# Patient Record
Sex: Male | Born: 1945 | Race: White | Hispanic: No | Marital: Married | Smoking: Former smoker
Health system: Southern US, Community
[De-identification: ages and names within clinical notes are randomized; demographics above are authoritative.]

## PROBLEM LIST (undated history)

## (undated) DIAGNOSIS — N189 Chronic kidney disease, unspecified: Secondary | ICD-10-CM

## (undated) DIAGNOSIS — I10 Essential (primary) hypertension: Secondary | ICD-10-CM

## (undated) DIAGNOSIS — Z8719 Personal history of other diseases of the digestive system: Secondary | ICD-10-CM

## (undated) DIAGNOSIS — J449 Chronic obstructive pulmonary disease, unspecified: Secondary | ICD-10-CM

## (undated) DIAGNOSIS — Z8711 Personal history of peptic ulcer disease: Secondary | ICD-10-CM

## (undated) DIAGNOSIS — E119 Type 2 diabetes mellitus without complications: Secondary | ICD-10-CM

## (undated) DIAGNOSIS — K219 Gastro-esophageal reflux disease without esophagitis: Secondary | ICD-10-CM

## (undated) DIAGNOSIS — G4730 Sleep apnea, unspecified: Secondary | ICD-10-CM

## (undated) DIAGNOSIS — I4891 Unspecified atrial fibrillation: Secondary | ICD-10-CM

## (undated) DIAGNOSIS — C801 Malignant (primary) neoplasm, unspecified: Secondary | ICD-10-CM

## (undated) HISTORY — PX: COLONOSCOPY: SHX174

## (undated) HISTORY — PX: SURGERY OF LIP: SUR1315

## (undated) HISTORY — PX: WRIST SURGERY: SHX841

## (undated) HISTORY — PX: UPPER GI ENDOSCOPY: SHX6162

## (undated) HISTORY — PX: SKIN CANCER EXCISION: SHX779

---

## 2018-10-02 ENCOUNTER — Institutional Professional Consult (permissible substitution) (HOSPITAL_COMMUNITY): Payer: Medicare Other

## 2018-10-02 ENCOUNTER — Inpatient Hospital Stay
Admission: AD | Admit: 2018-10-02 | Discharge: 2018-10-21 | Disposition: A | Discharge status: Other Acute Inpatient Hospital | Payer: Medicare Other | Source: Other Acute Inpatient Hospital | Attending: Internal Medicine | Admitting: Internal Medicine

## 2018-10-02 DIAGNOSIS — T8241XA Breakdown (mechanical) of vascular dialysis catheter, initial encounter: Secondary | ICD-10-CM

## 2018-10-02 DIAGNOSIS — K567 Ileus, unspecified: Secondary | ICD-10-CM

## 2018-10-02 DIAGNOSIS — R0603 Acute respiratory distress: Secondary | ICD-10-CM

## 2018-10-03 LAB — CBC WITH DIFFERENTIAL/PLATELET
Abs Immature Granulocytes: 0.3 10*3/uL — ABNORMAL HIGH (ref 0.00–0.07)
BASOS ABS: 0 10*3/uL (ref 0.0–0.1)
Basophils Relative: 0 %
EOS ABS: 0.3 10*3/uL (ref 0.0–0.5)
Eosinophils Relative: 2 %
HEMATOCRIT: 38.6 % — AB (ref 39.0–52.0)
HEMOGLOBIN: 12.8 g/dL — AB (ref 13.0–17.0)
Immature Granulocytes: 2 %
LYMPHS ABS: 1.2 10*3/uL (ref 0.7–4.0)
Lymphocytes Relative: 7 %
MCH: 26.6 pg (ref 26.0–34.0)
MCHC: 33.2 g/dL (ref 30.0–36.0)
MCV: 80.2 fL (ref 80.0–100.0)
MONOS PCT: 8 %
Monocytes Absolute: 1.2 10*3/uL — ABNORMAL HIGH (ref 0.1–1.0)
NEUTROS PCT: 81 %
NRBC: 0 % (ref 0.0–0.2)
Neutro Abs: 12.9 10*3/uL — ABNORMAL HIGH (ref 1.7–7.7)
Platelets: 246 10*3/uL (ref 150–400)
RBC: 4.81 MIL/uL (ref 4.22–5.81)
RDW: 15.6 % — AB (ref 11.5–15.5)
WBC: 15.9 10*3/uL — ABNORMAL HIGH (ref 4.0–10.5)

## 2018-10-03 LAB — COMPREHENSIVE METABOLIC PANEL
ALK PHOS: 57 U/L (ref 38–126)
ALT: 44 U/L (ref 0–44)
AST: 41 U/L (ref 15–41)
Albumin: 2.4 g/dL — ABNORMAL LOW (ref 3.5–5.0)
Anion gap: 22 — ABNORMAL HIGH (ref 5–15)
BUN: 112 mg/dL — AB (ref 8–23)
CALCIUM: 7.5 mg/dL — AB (ref 8.9–10.3)
CHLORIDE: 95 mmol/L — AB (ref 98–111)
CO2: 22 mmol/L (ref 22–32)
CREATININE: 7.43 mg/dL — AB (ref 0.61–1.24)
GFR calc non Af Amer: 6 mL/min — ABNORMAL LOW (ref >60)
GFR, EST AFRICAN AMERICAN: 7 mL/min — AB (ref >60)
Glucose, Bld: 62 mg/dL — ABNORMAL LOW (ref 70–99)
Potassium: 3.8 mmol/L (ref 3.5–5.1)
SODIUM: 139 mmol/L (ref 135–145)
Total Bilirubin: 1.1 mg/dL (ref 0.3–1.2)
Total Protein: 5.8 g/dL — ABNORMAL LOW (ref 6.5–8.1)

## 2018-10-03 LAB — MAGNESIUM: Magnesium: 2.3 mg/dL (ref 1.7–2.4)

## 2018-10-03 LAB — PROTIME-INR
INR: 1.19
Prothrombin Time: 15 seconds (ref 11.4–15.2)

## 2018-10-03 LAB — TSH: TSH: 1.825 u[IU]/mL (ref 0.350–4.500)

## 2018-10-03 LAB — PHOSPHORUS: Phosphorus: 12.1 mg/dL — ABNORMAL HIGH (ref 2.5–4.6)

## 2018-10-04 LAB — HEMOGLOBIN A1C
HEMOGLOBIN A1C: 7.9 % — AB (ref 4.8–5.6)
MEAN PLASMA GLUCOSE: 180 mg/dL

## 2018-10-04 LAB — RENAL FUNCTION PANEL
ALBUMIN: 2.4 g/dL — AB (ref 3.5–5.0)
ANION GAP: 23 — AB (ref 5–15)
BUN: 122 mg/dL — ABNORMAL HIGH (ref 8–23)
CALCIUM: 7.1 mg/dL — AB (ref 8.9–10.3)
CO2: 20 mmol/L — ABNORMAL LOW (ref 22–32)
CREATININE: 7.93 mg/dL — AB (ref 0.61–1.24)
Chloride: 95 mmol/L — ABNORMAL LOW (ref 98–111)
GFR calc non Af Amer: 6 mL/min — ABNORMAL LOW (ref >60)
GFR, EST AFRICAN AMERICAN: 7 mL/min — AB (ref >60)
GLUCOSE: 134 mg/dL — AB (ref 70–99)
PHOSPHORUS: 11.8 mg/dL — AB (ref 2.5–4.6)
Potassium: 3.6 mmol/L (ref 3.5–5.1)
SODIUM: 138 mmol/L (ref 135–145)

## 2018-10-04 LAB — CBC
HCT: 38 % — ABNORMAL LOW (ref 39.0–52.0)
HEMOGLOBIN: 12.7 g/dL — AB (ref 13.0–17.0)
MCH: 26.7 pg (ref 26.0–34.0)
MCHC: 33.4 g/dL (ref 30.0–36.0)
MCV: 80 fL (ref 80.0–100.0)
PLATELETS: 242 10*3/uL (ref 150–400)
RBC: 4.75 MIL/uL (ref 4.22–5.81)
RDW: 15.3 % (ref 11.5–15.5)
WBC: 14.2 10*3/uL — AB (ref 4.0–10.5)
nRBC: 0 % (ref 0.0–0.2)

## 2018-10-04 LAB — URINE CULTURE: Culture: NO GROWTH

## 2018-10-04 LAB — MAGNESIUM: Magnesium: 2.3 mg/dL (ref 1.7–2.4)

## 2018-10-04 MED ORDER — PANTOPRAZOLE SODIUM 40 MG IV SOLR
40.00 | INTRAVENOUS | Status: DC
Start: 2018-10-02 — End: 2018-10-04

## 2018-10-04 MED ORDER — INSULIN LISPRO 100 UNIT/ML ~~LOC~~ SOLN
2.00 | SUBCUTANEOUS | Status: DC
Start: 2018-10-02 — End: 2018-10-04

## 2018-10-04 MED ORDER — LACTATED RINGERS IV SOLN
INTRAVENOUS | Status: DC
Start: ? — End: 2018-10-04

## 2018-10-04 MED ORDER — DEXTROSE 10 % IV SOLN
125.00 | INTRAVENOUS | Status: DC
Start: ? — End: 2018-10-04

## 2018-10-04 MED ORDER — ACETAMINOPHEN 650 MG RE SUPP
650.00 | RECTAL | Status: DC
Start: ? — End: 2018-10-04

## 2018-10-04 MED ORDER — HYDRALAZINE HCL 20 MG/ML IJ SOLN
10.00 | INTRAMUSCULAR | Status: DC
Start: ? — End: 2018-10-04

## 2018-10-04 MED ORDER — GENERIC EXTERNAL MEDICATION
2.25 | Status: DC
Start: 2018-10-02 — End: 2018-10-04

## 2018-10-04 MED ORDER — LINEZOLID 600 MG/300ML IV SOLN
600.00 | INTRAVENOUS | Status: DC
Start: 2018-10-02 — End: 2018-10-04

## 2018-10-04 MED ORDER — ALUM & MAG HYDROXIDE-SIMETH 400-400-40 MG/5ML PO SUSP
15.00 | ORAL | Status: DC
Start: ? — End: 2018-10-04

## 2018-10-04 MED ORDER — ZIPRASIDONE MESYLATE 20 MG IM SOLR
10.00 | INTRAMUSCULAR | Status: DC
Start: ? — End: 2018-10-04

## 2018-10-04 MED ORDER — INSULIN GLARGINE 100 UNIT/ML ~~LOC~~ SOLN
20.00 | SUBCUTANEOUS | Status: DC
Start: 2018-10-02 — End: 2018-10-04

## 2018-10-04 MED ORDER — BISACODYL 10 MG RE SUPP
10.00 | RECTAL | Status: DC
Start: ? — End: 2018-10-04

## 2018-10-04 MED ORDER — FLUTICASONE PROPIONATE 50 MCG/ACT NA SUSP
1.00 | NASAL | Status: DC
Start: 2018-10-03 — End: 2018-10-04

## 2018-10-04 MED ORDER — IPRATROPIUM-ALBUTEROL 0.5-2.5 (3) MG/3ML IN SOLN
3.00 | RESPIRATORY_TRACT | Status: DC
Start: 2018-10-02 — End: 2018-10-04

## 2018-10-04 MED ORDER — LABETALOL HCL 5 MG/ML IV SOLN
10.00 | INTRAVENOUS | Status: DC
Start: 2018-10-02 — End: 2018-10-04

## 2018-10-04 MED ORDER — BUDESONIDE 0.5 MG/2ML IN SUSP
0.50 | RESPIRATORY_TRACT | Status: DC
Start: 2018-10-02 — End: 2018-10-04

## 2018-10-04 MED ORDER — ACETAMINOPHEN 325 MG/10.15ML PO SOLN
325.00 | ORAL | Status: DC
Start: ? — End: 2018-10-04

## 2018-10-04 MED ORDER — ALBUTEROL SULFATE (2.5 MG/3ML) 0.083% IN NEBU
2.50 | INHALATION_SOLUTION | RESPIRATORY_TRACT | Status: DC
Start: ? — End: 2018-10-04

## 2018-10-04 NOTE — Consult Note (Signed)
[facility] CONSULT NOTE    Date: 10/04/2018                  Patient Name:  Gilbert White  MRN: [id number]  DOB: Nov 21, 1946  Age / Sex: 72 y.o., male         PCP: System, Pcp Not In                 Service Requesting Consult: Hospitalist                 Reason for Consult: Acute renal failure/CKD stage IV            History of Present Illness: Patient is a 72 y.o. male with a PMHx of morbid obesity, tobacco abuse, obstructive sleep apnea, diabetes mellitus type 2, hypertension, hyperlipidemia, COPD, prostate cancer, peripheral vascular disease, hypertension, low-grade papillary cancer of the bladder, chronic kidney disease stage IV, who was admitted to [facility] on 10/02/2018 for ongoing management.  His primary issue was acute on chronic hypoxic hypercarbic respiratory failure which was initially treated with BiPAP.  He subsequently had intubation.  He also underwent aggressive dialysis and net fluid removal was 21 L during his hospitalization.  He also subsequently developed marked toxic metabolic encephalopathy which was felt to be multifactorial as the patient was previously on Lyrica, OxyContin, and Cymbalta.  He has a right internal jugular PermCath in place.  He remains quite weak at the moment.  His wife is currently at the bedside.  He will need significant rehabilitation before being able to be transitioned to home or nursing home.   Medications:  Current medications: 1 nasal lid 600 mg IV twice daily, Zosyn 2.25 g IV 3 times daily, Humalog insulin, amlodipine 5 mg daily, budesonide 0.5 mg inhaled twice daily, fluticasone 1 spray intranasal daily, heparin 5000 units subcutaneous every 8 hours, albuterol/ipratropium 3 cc inhaled 3 times daily, Lantus 20 units twice daily, metoprolol 25 mg 3 times daily, mirtazapine 15 mg nightly, renal vitamin 1 tablet daily, nicotine patch every 24 hours, pantoprazole 40 mg daily, Renvela 1.6 g 3 times  daily   Allergies: No known drug allergies  Past Medical History: morbid obesity, tobacco abuse, obstructive sleep apnea, diabetes mellitus type 2, hypertension, hyperlipidemia, COPD, prostate cancer, peripheral vascular disease, hypertension, low-grade papillary cancer of the bladder, chronic kidney disease stage IV,  Past Surgical History: Right internal jugular PermCath placement  Family History: No family history of end-stage renal disease.  Social History: Married, extensive history of tobacco abuse, no current alcohol use.  Review of Systems: Patient unable to provide review of systems secondary to encephalopathy  Vital Signs: Temperature 97.5 pulse 77 respirations 16 blood pressure 92/47 Weight trends: There were no vitals filed for this visit.  Physical Exam: General: NAD, resting in bed  Head: Normocephalic, atraumatic.  Eyes: Anicteric, EOMI  Nose: Mucous membranes moist, not inflammed, nonerythematous.  Throat: Oropharynx nonerythematous, no exudate appreciated.   Neck: Supple, trachea midline.  Lungs:  Normal respiratory effort. Scattered rhonchi and wheezes  Heart: RRR. S1 and S2 no rubs  Abdomen:  BS normoactive. Soft, Nondistended, non-tender.  No masses or organomegaly.  Extremities: Trace pretibial edema.  Neurologic: Awake but confused, will follow simple commands  Skin: No visible rashes, scars.    Lab results: Basic Metabolic Panel: Recent Labs  Lab 10/03/18 0650 10/04/18 0602  NA 139 138  K 3.8 3.6  CL 95* 95*  CO2 22 20*  GLUCOSE 62* 134*  BUN 112* 122*  CREATININE 7.43* 7.93*  CALCIUM 7.5* 7.1*  MG 2.3 2.3  PHOS 12.1* 11.8*    Liver Function Tests: Recent Labs  Lab 10/03/18 0650 10/04/18 0602  AST 41  --   ALT 44  --   ALKPHOS 57  --   BILITOT 1.1  --   PROT 5.8*  --   ALBUMIN 2.4* 2.4*   No results for input(s): LIPASE, AMYLASE in the last 168 hours. No results for input(s): AMMONIA in the last 168 hours.  CBC: Recent  Labs  Lab 10/03/18 0650 10/04/18 0602  WBC 15.9* 14.2*  NEUTROABS 12.9*  --   HGB 12.8* 12.7*  HCT 38.6* 38.0*  MCV 80.2 80.0  PLT 246 242    Cardiac Enzymes: No results for input(s): CKTOTAL, CKMB, CKMBINDEX, TROPONINI in the last 168 hours.  BNP: Invalid input(s): POCBNP  CBG: No results for input(s): GLUCAP in the last 168 hours.  Microbiology: No results found for this or any previous visit.  Coagulation Studies: Recent Labs    10/03/18 0650  LABPROT 15.0  INR 1.19    Urinalysis: No results for input(s): COLORURINE, LABSPEC, PHURINE, GLUCOSEU, HGBUR, BILIRUBINUR, KETONESUR, PROTEINUR, UROBILINOGEN, NITRITE, LEUKOCYTESUR in the last 72 hours.  Invalid input(s): APPERANCEUR    Imaging: Dg Chest Port 1 View  Result Date: 10/02/2018 CLINICAL DATA:  Acute respiratory distress, dialysis patient EXAM: PORTABLE CHEST 1 VIEW COMPARISON:  None. FINDINGS: Right IJ dialysis catheter tips lower SVC. Cardiomegaly evident with vascular congestion and basilar interstitial changes suspicious for basilar edema, worse on the left. Difficult to exclude basilar pneumonia. No large effusion or pneumothorax. Trachea is midline. Aorta atherosclerotic. IMPRESSION: Cardiomegaly with asymmetric basilar edema changes versus pneumonia, worse on the left. Electronically Signed   By: Jerilynn Mages.  Shick M.D.   On: 10/02/2018 14:04   Dg Abd Portable 1v  Result Date: 10/02/2018 CLINICAL DATA:  Respiratory distress, ileus, distension EXAM: PORTABLE ABDOMEN - 1 VIEW COMPARISON:  None. FINDINGS: Mild mid abdominal small bowel distension but air and stool throughout the colon. Appearance remains nonspecific. No significant obstruction pattern. Difficult to exclude mild ileus. Postop changes in the pelvis. Aortoiliac atherosclerosis present. Degenerative changes of the spine, pelvis and hips. IMPRESSION: Nonspecific bowel gas pattern with mild distention centrally. No obstruction. Electronically Signed   By: Jerilynn Mages.   Shick M.D.   On: 10/02/2018 14:08      Assessment & Plan: Pt is a 72 y.o. male with a PMHx of morbid obesity, tobacco abuse, obstructive sleep apnea, diabetes mellitus type 2, hypertension, hyperlipidemia, COPD, prostate cancer, peripheral vascular disease, hypertension, low-grade papillary cancer of the bladder, chronic kidney disease stage IV, who was admitted to [facility] on 10/02/2018 for ongoing management.   1.  Acute renal failure/chronic kidney disease stage IV baseline EGFR 19.  Patient has severe ongoing acute renal failure that is dialysis dependent.  He has a right internal jugular PermCath in place.  Urine output was only 580 cc over the preceding 24 hours.  Suspect that he will likely develop end-stage renal disease during the course of this admission.  We will plan for hemodialysis today and place him on a Monday, Wednesday, Friday schedule.  2.  Anemia of chronic kidney disease.  Hemoglobin currently 12.7.  No indication for Procrit.  3.  Secondary hyperparathyroidism.  Evaluate bone mineral metabolism parameters.  4.  Hypertension.  Maintain the patient on amlodipine and metoprolol at this time.

## 2018-10-05 LAB — HEPATITIS B CORE ANTIBODY, TOTAL: Hep B Core Total Ab: NEGATIVE

## 2018-10-05 LAB — HEPATITIS B SURFACE ANTIGEN: Hepatitis B Surface Ag: NEGATIVE

## 2018-10-06 LAB — RENAL FUNCTION PANEL
Albumin: 2.2 g/dL — ABNORMAL LOW (ref 3.5–5.0)
Anion gap: 19 — ABNORMAL HIGH (ref 5–15)
BUN: 76 mg/dL — AB (ref 8–23)
CALCIUM: 7.7 mg/dL — AB (ref 8.9–10.3)
CHLORIDE: 99 mmol/L (ref 98–111)
CO2: 20 mmol/L — ABNORMAL LOW (ref 22–32)
CREATININE: 7.07 mg/dL — AB (ref 0.61–1.24)
GFR calc Af Amer: 8 mL/min — ABNORMAL LOW (ref >60)
GFR calc non Af Amer: 7 mL/min — ABNORMAL LOW (ref >60)
GLUCOSE: 126 mg/dL — AB (ref 70–99)
PHOSPHORUS: 8.9 mg/dL — AB (ref 2.5–4.6)
Potassium: 3.6 mmol/L (ref 3.5–5.1)
Sodium: 138 mmol/L (ref 135–145)

## 2018-10-06 LAB — CBC
HCT: 37.3 % — ABNORMAL LOW (ref 39.0–52.0)
Hemoglobin: 11.7 g/dL — ABNORMAL LOW (ref 13.0–17.0)
MCH: 26 pg (ref 26.0–34.0)
MCHC: 31.4 g/dL (ref 30.0–36.0)
MCV: 82.9 fL (ref 80.0–100.0)
PLATELETS: 220 10*3/uL (ref 150–400)
RBC: 4.5 MIL/uL (ref 4.22–5.81)
RDW: 15.9 % — AB (ref 11.5–15.5)
WBC: 10.1 10*3/uL (ref 4.0–10.5)
nRBC: 0 % (ref 0.0–0.2)

## 2018-10-06 LAB — PARATHYROID HORMONE, INTACT (NO CA): PTH: 259 pg/mL — AB (ref 15–65)

## 2018-10-06 NOTE — Progress Notes (Addendum)
  10/06/18  Subjective:   Patient underwent dialysis today.  Tolerated well.  Blood flow 400 cc was achieved.  Net  Ultrafiltration 1800 cc   Objective:  Vital signs in last 24 hours:  BP: ()/()  Arterial Line BP: ()/()   Weight change:  There were no vitals filed for this visit.  Intake/Output:   No intake or output data in the 24 hours ending 10/06/18 1741   Physical Exam: General:  No acute distress, laying in the bed  HEENT  moist oral mucous membranes  Neck  supple, no masses  Pulm/lungs  normal breathing effort, room air  CVS/Heart  irregular, atrial fibrillation  Abdomen:   Soft, mildly distended  Extremities:  No peripheral edema  Neurologic:  Alert, oriented  Skin:  No acute rashes  Access:  Right IJ PermCath       Basic Metabolic Panel:  Recent Labs  Lab 10/03/18 0650 10/04/18 0602 10/06/18 0439  NA 139 138 138  K 3.8 3.6 3.6  CL 95* 95* 99  CO2 22 20* 20*  GLUCOSE 62* 134* 126*  BUN 112* 122* 76*  CREATININE 7.43* 7.93* 7.07*  CALCIUM 7.5* 7.1* 7.7*  MG 2.3 2.3  --   PHOS 12.1* 11.8* 8.9*     CBC: Recent Labs  Lab 10/03/18 0650 10/04/18 0602 10/06/18 0439  WBC 15.9* 14.2* 10.1  NEUTROABS 12.9*  --   --   HGB 12.8* 12.7* 11.7*  HCT 38.6* 38.0* 37.3*  MCV 80.2 80.0 82.9  PLT 246 242 220      Lab Results  Component Value Date   HEPBSAG Negative 10/04/2018      Microbiology:  Recent Results (from the past 240 hour(s))  Culture, Urine     Status: None   Collection Time: 10/03/18  3:35 PM  Result Value Ref Range Status   Specimen Description URINE, CATHETERIZED  Final   Special Requests NONE  Final   Culture   Final    NO GROWTH Performed at [facility], [address], [place], [place] [address]    Report Status 10/04/2018 FINAL  Final    Coagulation Studies: No results for input(s): LABPROT, INR in the last 72 hours.  Urinalysis: No results for input(s): COLORURINE, LABSPEC, PHURINE, GLUCOSEU, HGBUR,  BILIRUBINUR, KETONESUR, PROTEINUR, UROBILINOGEN, NITRITE, LEUKOCYTESUR in the last 72 hours.  Invalid input(s): APPERANCEUR    Imaging: No results found.   Medications:       Assessment/ Plan:  72 y.o. male morbid obesity, tobacco abuse, obstructive sleep apnea, diabetes mellitus type 2, hypertension, hyperlipidemia, COPD, prostate cancer, peripheral vascular disease, hypertension, low-grade papillary cancer of the bladder, chronic kidney disease stage IV, who was admitted to [facility] on 10/02/2018 for ongoing management.   1.  Acute renal failure/chronic kidney disease stage IV baseline EGFR 19.  Patient has severe ongoing acute renal failure that is dialysis dependent.  He has a right internal jugular PermCath in place.  Urine output remains poor. We will continue hemodialysis for now on Monday, Wednesday, Friday schedule  2.  Hyperphosphatemia Add PTH to next labs Improving, follow low phosphorus diet     LOS: 0 Gilbert White Gilbert White 10/23/20195:41 PM  [place], [place]  Note: This note was prepared with Dragon dictation. Any transcription errors are unintentional

## 2018-10-08 LAB — RENAL FUNCTION PANEL
Albumin: 2.5 g/dL — ABNORMAL LOW (ref 3.5–5.0)
Anion gap: 14 (ref 5–15)
BUN: 32 mg/dL — AB (ref 8–23)
CHLORIDE: 101 mmol/L (ref 98–111)
CO2: 24 mmol/L (ref 22–32)
CREATININE: 4.08 mg/dL — AB (ref 0.61–1.24)
Calcium: 8.6 mg/dL — ABNORMAL LOW (ref 8.9–10.3)
GFR calc Af Amer: 15 mL/min — ABNORMAL LOW (ref >60)
GFR, EST NON AFRICAN AMERICAN: 13 mL/min — AB (ref >60)
Glucose, Bld: 130 mg/dL — ABNORMAL HIGH (ref 70–99)
POTASSIUM: 3.7 mmol/L (ref 3.5–5.1)
Phosphorus: 3.6 mg/dL (ref 2.5–4.6)
Sodium: 139 mmol/L (ref 135–145)

## 2018-10-08 LAB — CBC
HEMATOCRIT: 40.3 % (ref 39.0–52.0)
HEMOGLOBIN: 12.6 g/dL — AB (ref 13.0–17.0)
MCH: 25.9 pg — ABNORMAL LOW (ref 26.0–34.0)
MCHC: 31.3 g/dL (ref 30.0–36.0)
MCV: 82.9 fL (ref 80.0–100.0)
Platelets: 248 10*3/uL (ref 150–400)
RBC: 4.86 MIL/uL (ref 4.22–5.81)
RDW: 15.9 % — AB (ref 11.5–15.5)
WBC: 12 10*3/uL — ABNORMAL HIGH (ref 4.0–10.5)
nRBC: 0 % (ref 0.0–0.2)

## 2018-10-08 NOTE — Progress Notes (Signed)
  10/08/18  Subjective:  Patient seen and evaluated during hemodialysis. Tolerating well.    Objective:  Vital signs in last 24 hours:  Temperature 98.4 pulse 87 respirations 19 blood pressure 149/94   Physical Exam: General:  No acute distress, laying in the bed  HEENT  moist oral mucous membranes  Neck  supple  Pulm/lungs  normal breathing effort, room air, scattered rales  CVS/Heart  irregular, atrial fibrillation  Abdomen:   Soft, mildly distended  Extremities:  No peripheral edema  Neurologic:  Alert, oriented  Skin:  No acute rashes  Access:  Right IJ PermCath       Basic Metabolic Panel:  Recent Labs  Lab 10/03/18 0650 10/04/18 0602 10/06/18 0439  NA 139 138 138  K 3.8 3.6 3.6  CL 95* 95* 99  CO2 22 20* 20*  GLUCOSE 62* 134* 126*  BUN 112* 122* 76*  CREATININE 7.43* 7.93* 7.07*  CALCIUM 7.5* 7.1* 7.7*  MG 2.3 2.3  --   PHOS 12.1* 11.8* 8.9*     CBC: Recent Labs  Lab 10/03/18 0650 10/04/18 0602 10/06/18 0439  WBC 15.9* 14.2* 10.1  NEUTROABS 12.9*  --   --   HGB 12.8* 12.7* 11.7*  HCT 38.6* 38.0* 37.3*  MCV 80.2 80.0 82.9  PLT 246 242 220      Lab Results  Component Value Date   HEPBSAG Negative 10/04/2018      Microbiology:  Recent Results (from the past 240 hour(s))  Culture, Urine     Status: None   Collection Time: 10/03/18  3:35 PM  Result Value Ref Range Status   Specimen Description URINE, CATHETERIZED  Final   Special Requests NONE  Final   Culture   Final    NO GROWTH Performed at [facility], [address], [place], [place] [address]    Report Status 10/04/2018 FINAL  Final    Coagulation Studies: No results for input(s): LABPROT, INR in the last 72 hours.  Urinalysis: No results for input(s): COLORURINE, LABSPEC, PHURINE, GLUCOSEU, HGBUR, BILIRUBINUR, KETONESUR, PROTEINUR, UROBILINOGEN, NITRITE, LEUKOCYTESUR in the last 72 hours.  Invalid input(s): APPERANCEUR    Imaging: No results  found.   Medications:       Assessment/ Plan:  72 y.o. male morbid obesity, tobacco abuse, obstructive sleep apnea, diabetes mellitus type 2, hypertension, hyperlipidemia, COPD, prostate cancer, peripheral vascular disease, hypertension, low-grade papillary cancer of the bladder, chronic kidney disease stage IV, who was admitted to [facility] on 10/02/2018 for ongoing management.   1.  Acute renal failure/chronic kidney disease stage IV baseline EGFR 19.    BUN and creatinine remain quite high.  Therefore he will need ongoing dialysis.  Patient seen and evaluated during dialysis treatment.  We will plan to complete dialysis treatment today and we will plan for dialysis again on Monday.  2.  Secondary hyperparathyroidism.  PTH currently 259.  Phosphorus is trending down and currently 8.9.  3.  Anemia chronic kidney disease but hemoglobin currently 11.7.  Hold off on Epogen for now.     LOS: 0 Sharetha Newson 10/25/20191:01 PM  [place], [place]  Note: This note was prepared with Dragon dictation. Any transcription errors are unintentional

## 2018-10-09 LAB — PTH, INTACT AND CALCIUM
Calcium, Total (PTH): 8.6 mg/dL (ref 8.6–10.2)
PTH: 171 pg/mL — ABNORMAL HIGH (ref 15–65)

## 2018-10-11 LAB — CBC
HEMATOCRIT: 37.8 % — AB (ref 39.0–52.0)
HEMOGLOBIN: 12.3 g/dL — AB (ref 13.0–17.0)
MCH: 26.9 pg (ref 26.0–34.0)
MCHC: 32.5 g/dL (ref 30.0–36.0)
MCV: 82.7 fL (ref 80.0–100.0)
Platelets: 179 10*3/uL (ref 150–400)
RBC: 4.57 MIL/uL (ref 4.22–5.81)
RDW: 15.9 % — ABNORMAL HIGH (ref 11.5–15.5)
WBC: 11.4 10*3/uL — ABNORMAL HIGH (ref 4.0–10.5)
nRBC: 0 % (ref 0.0–0.2)

## 2018-10-11 LAB — RENAL FUNCTION PANEL
ANION GAP: 15 (ref 5–15)
Albumin: 2.7 g/dL — ABNORMAL LOW (ref 3.5–5.0)
BUN: 80 mg/dL — ABNORMAL HIGH (ref 8–23)
CO2: 21 mmol/L — AB (ref 22–32)
Calcium: 8.4 mg/dL — ABNORMAL LOW (ref 8.9–10.3)
Chloride: 102 mmol/L (ref 98–111)
Creatinine, Ser: 8.92 mg/dL — ABNORMAL HIGH (ref 0.61–1.24)
GFR calc Af Amer: 6 mL/min — ABNORMAL LOW (ref >60)
GFR calc non Af Amer: 5 mL/min — ABNORMAL LOW (ref >60)
GLUCOSE: 87 mg/dL (ref 70–99)
POTASSIUM: 3.8 mmol/L (ref 3.5–5.1)
Phosphorus: 6.5 mg/dL — ABNORMAL HIGH (ref 2.5–4.6)
SODIUM: 138 mmol/L (ref 135–145)

## 2018-10-11 LAB — MAGNESIUM: Magnesium: 2.4 mg/dL (ref 1.7–2.4)

## 2018-10-11 NOTE — Progress Notes (Signed)
  10/11/18  Subjective:  Patient remains dialysis dependent at this time. Sitting up in a chair this a.m. Appears to be in good spirits.    Objective:  Vital signs in last 24 hours:  Temperature 97.6 pulse 87 respirations 26 blood pressure 122/83   Physical Exam: General:  No acute distress, laying in the bed  HEENT  moist oral mucous membranes  Neck  supple  Pulm/lungs  normal breathing effort,  scattered rales  CVS/Heart  irregular, atrial fibrillation  Abdomen:   Soft, mildly distended  Extremities:  No peripheral edema  Neurologic:  Alert, oriented  Skin:  No acute rashes  Access:  Right IJ PermCath       Basic Metabolic Panel:  Recent Labs  Lab 10/06/18 0439 10/08/18 0724 10/08/18 1456 10/11/18 0713  NA 138  --  139 138  K 3.6  --  3.7 3.8  CL 99  --  101 102  CO2 20*  --  24 21*  GLUCOSE 126*  --  130* 87  BUN 76*  --  32* 80*  CREATININE 7.07*  --  4.08* 8.92*  CALCIUM 7.7* 8.6 8.6* 8.4*  MG  --   --   --  2.4  PHOS 8.9*  --  3.6 6.5*     CBC: Recent Labs  Lab 10/06/18 0439 10/08/18 1456 10/11/18 0713  WBC 10.1 12.0* 11.4*  HGB 11.7* 12.6* 12.3*  HCT 37.3* 40.3 37.8*  MCV 82.9 82.9 82.7  PLT 220 248 179      Lab Results  Component Value Date   HEPBSAG Negative 10/04/2018      Microbiology:  Recent Results (from the past 240 hour(s))  Culture, Urine     Status: None   Collection Time: 10/03/18  3:35 PM  Result Value Ref Range Status   Specimen Description URINE, CATHETERIZED  Final   Special Requests NONE  Final   Culture   Final    NO GROWTH Performed at [facility], [place] [address] [place], [place] [address]    Report Status 10/04/2018 FINAL  Final    Coagulation Studies: No results for input(s): LABPROT, INR in the last 72 hours.  Urinalysis: No results for input(s): COLORURINE, LABSPEC, PHURINE, GLUCOSEU, HGBUR, BILIRUBINUR, KETONESUR, PROTEINUR, UROBILINOGEN, NITRITE, LEUKOCYTESUR in the last 72  hours.  Invalid input(s): APPERANCEUR    Imaging: No results found.   Medications:       Assessment/ Plan:  72 y.o. male morbid obesity, tobacco abuse, obstructive sleep apnea, diabetes mellitus type 2, hypertension, hyperlipidemia, COPD, prostate cancer, peripheral vascular disease, hypertension, low-grade papillary cancer of the bladder, chronic kidney disease stage IV, who was admitted to [facility] on 10/02/2018 for ongoing management.   1.  Acute renal failure/chronic kidney disease stage IV baseline EGFR 19.    Patient remains dialysis dependent at the moment.  We plan to perform dialysis today.  Thereafter next dialysis will be on Wednesday.  2.  Secondary hyperparathyroidism.  Serum phosphorus has come down to 6.5.  Continue to monitor.  3.  Anemia chronic kidney disease.  Hemoglobin currently 12.3.  No indication for Procrit.   LOS: 0 Jeyla Bulger 10/28/20198:24 AM  [place], [place]  Note: This note was prepared with Dragon dictation. Any transcription errors are unintentional

## 2018-10-13 LAB — RENAL FUNCTION PANEL
ALBUMIN: 2.5 g/dL — AB (ref 3.5–5.0)
Anion gap: 13 (ref 5–15)
BUN: 71 mg/dL — ABNORMAL HIGH (ref 8–23)
CALCIUM: 8.3 mg/dL — AB (ref 8.9–10.3)
CO2: 24 mmol/L (ref 22–32)
CREATININE: 8.25 mg/dL — AB (ref 0.61–1.24)
Chloride: 101 mmol/L (ref 98–111)
GFR calc Af Amer: 7 mL/min — ABNORMAL LOW (ref >60)
GFR calc non Af Amer: 6 mL/min — ABNORMAL LOW (ref >60)
GLUCOSE: 112 mg/dL — AB (ref 70–99)
PHOSPHORUS: 7.5 mg/dL — AB (ref 2.5–4.6)
Potassium: 4.6 mmol/L (ref 3.5–5.1)
SODIUM: 138 mmol/L (ref 135–145)

## 2018-10-13 LAB — CBC
HCT: 39 % (ref 39.0–52.0)
Hemoglobin: 12.3 g/dL — ABNORMAL LOW (ref 13.0–17.0)
MCH: 26.5 pg (ref 26.0–34.0)
MCHC: 31.5 g/dL (ref 30.0–36.0)
MCV: 83.9 fL (ref 80.0–100.0)
Platelets: 123 10*3/uL — ABNORMAL LOW (ref 150–400)
RBC: 4.65 MIL/uL (ref 4.22–5.81)
RDW: 16.2 % — ABNORMAL HIGH (ref 11.5–15.5)
WBC: 9 10*3/uL (ref 4.0–10.5)
nRBC: 0 % (ref 0.0–0.2)

## 2018-10-13 NOTE — Progress Notes (Signed)
  10/13/18  Subjective:  Patient seen and evaluated during hemodialysis. Tolerating well.   Objective:  Vital signs in last 24 hours:  Temperature 97.1 pulse 98 respirations 20 blood pressure 133/59   Physical Exam: General:  No acute distress, laying in the bed  HEENT  moist oral mucous membranes  Neck  supple  Pulm/lungs  normal breathing effort, scattered rhonchi  CVS/Heart  irregular, atrial fibrillation  Abdomen:   Soft, mildly distended  Extremities:  No peripheral edema  Neurologic:  Alert, oriented  Skin:  No acute rashes  Access:  Right IJ PermCath       Basic Metabolic Panel:  Recent Labs  Lab 10/08/18 1456 10/11/18 0713 10/13/18 0648  NA 139 138 138  K 3.7 3.8 4.6  CL 101 102 101  CO2 24 21* 24  GLUCOSE 130* 87 112*  BUN 32* 80* 71*  CREATININE 4.08* 8.92* 8.25*  CALCIUM 8.6* 8.4* 8.3*  MG  --  2.4  --   PHOS 3.6 6.5* 7.5*     CBC: Recent Labs  Lab 10/08/18 1456 10/11/18 0713 10/13/18 0648  WBC 12.0* 11.4* 9.0  HGB 12.6* 12.3* 12.3*  HCT 40.3 37.8* 39.0  MCV 82.9 82.7 83.9  PLT 248 179 123*      Lab Results  Component Value Date   HEPBSAG Negative 10/04/2018      Microbiology:  Recent Results (from the past 240 hour(s))  Culture, Urine     Status: None   Collection Time: 10/03/18  3:35 PM  Result Value Ref Range Status   Specimen Description URINE, CATHETERIZED  Final   Special Requests NONE  Final   Culture   Final    NO GROWTH Performed at [facility], [address] [place], [place] [address]    Report Status 10/04/2018 FINAL  Final    Coagulation Studies: No results for input(s): LABPROT, INR in the last 72 hours.  Urinalysis: No results for input(s): COLORURINE, LABSPEC, PHURINE, GLUCOSEU, HGBUR, BILIRUBINUR, KETONESUR, PROTEINUR, UROBILINOGEN, NITRITE, LEUKOCYTESUR in the last 72 hours.  Invalid input(s): APPERANCEUR    Imaging: No results found.   Medications:       Assessment/ Plan:  72  y.o. male morbid obesity, tobacco abuse, obstructive sleep apnea, diabetes mellitus type 2, hypertension, hyperlipidemia, COPD, prostate cancer, peripheral vascular disease, hypertension, low-grade papillary cancer of the bladder, chronic kidney disease stage IV, who was admitted to [facility] on 10/02/2018 for ongoing management.   1.  Acute renal failure/chronic kidney disease stage IV baseline EGFR 19.    Patient seen and evaluated during hemodialysis today.  BUN and creatinine remain high.  He remains dialysis dependent.  Complete dialysis today.  2.  Secondary hyperparathyroidism.  Serum phosphorus still high at 7.5.  Continue to monitor and recheck on Friday.  3.  Anemia chronic kidney disease.  Hemoglobin 12.3 and stable.  No indication for Epogen.   LOS: 0 Jayelle Page 10/30/20191:53 PM  [place], [place]  Note: This note was prepared with Dragon dictation. Any transcription errors are unintentional

## 2018-10-15 ENCOUNTER — Encounter (HOSPITAL_COMMUNITY): Payer: Medicare Other

## 2018-10-15 LAB — RENAL FUNCTION PANEL
ALBUMIN: 2.6 g/dL — AB (ref 3.5–5.0)
Anion gap: 12 (ref 5–15)
BUN: 66 mg/dL — AB (ref 8–23)
CALCIUM: 8.4 mg/dL — AB (ref 8.9–10.3)
CO2: 23 mmol/L (ref 22–32)
CREATININE: 8.27 mg/dL — AB (ref 0.61–1.24)
Chloride: 102 mmol/L (ref 98–111)
GFR, EST AFRICAN AMERICAN: 7 mL/min — AB (ref >60)
GFR, EST NON AFRICAN AMERICAN: 6 mL/min — AB (ref >60)
Glucose, Bld: 108 mg/dL — ABNORMAL HIGH (ref 70–99)
Phosphorus: 4.7 mg/dL — ABNORMAL HIGH (ref 2.5–4.6)
Potassium: 4.1 mmol/L (ref 3.5–5.1)
SODIUM: 137 mmol/L (ref 135–145)

## 2018-10-15 LAB — CBC
HCT: 37.1 % — ABNORMAL LOW (ref 39.0–52.0)
Hemoglobin: 12.2 g/dL — ABNORMAL LOW (ref 13.0–17.0)
MCH: 27.4 pg (ref 26.0–34.0)
MCHC: 32.9 g/dL (ref 30.0–36.0)
MCV: 83.2 fL (ref 80.0–100.0)
PLATELETS: 93 10*3/uL — AB (ref 150–400)
RBC: 4.46 MIL/uL (ref 4.22–5.81)
RDW: 16.1 % — AB (ref 11.5–15.5)
WBC: 7.8 10*3/uL (ref 4.0–10.5)
nRBC: 0 % (ref 0.0–0.2)

## 2018-10-15 NOTE — Progress Notes (Signed)
  10/15/18  Subjective:  She remains dialysis dependent. Due for hemodialysis today. Currently resting comfortably.   Objective:  Vital signs in last 24 hours:  Temperature 98 pulse 86 respirations 20 blood pressure 100/68   Physical Exam: General:  No acute distress, laying in the bed  HEENT  moist oral mucous membranes  Neck  supple  Pulm/lungs  normal breathing effort, scattered rhonchi  CVS/Heart  irregular, atrial fibrillation  Abdomen:   Soft, mildly distended  Extremities:  No peripheral edema  Neurologic:  Alert, oriented  Skin:  No acute rashes  Access:  Right IJ PermCath       Basic Metabolic Panel:  Recent Labs  Lab 10/08/18 1456 10/11/18 0713 10/13/18 0648 10/15/18 0619  NA 139 138 138 137  K 3.7 3.8 4.6 4.1  CL 101 102 101 102  CO2 24 21* 24 23  GLUCOSE 130* 87 112* 108*  BUN 32* 80* 71* 66*  CREATININE 4.08* 8.92* 8.25* 8.27*  CALCIUM 8.6* 8.4* 8.3* 8.4*  MG  --  2.4  --   --   PHOS 3.6 6.5* 7.5* 4.7*     CBC: Recent Labs  Lab 10/08/18 1456 10/11/18 0713 10/13/18 0648 10/15/18 0619  WBC 12.0* 11.4* 9.0 7.8  HGB 12.6* 12.3* 12.3* 12.2*  HCT 40.3 37.8* 39.0 37.1*  MCV 82.9 82.7 83.9 83.2  PLT 248 179 123* 93*      Lab Results  Component Value Date   HEPBSAG Negative 10/04/2018      Microbiology:  No results found for this or any previous visit (from the past 240 hour(s)).  Coagulation Studies: No results for input(s): LABPROT, INR in the last 72 hours.  Urinalysis: No results for input(s): COLORURINE, LABSPEC, PHURINE, GLUCOSEU, HGBUR, BILIRUBINUR, KETONESUR, PROTEINUR, UROBILINOGEN, NITRITE, LEUKOCYTESUR in the last 72 hours.  Invalid input(s): APPERANCEUR    Imaging: No results found.   Medications:       Assessment/ Plan:  72 y.o. male morbid obesity, tobacco abuse, obstructive sleep apnea, diabetes mellitus type 2, hypertension, hyperlipidemia, COPD, prostate cancer, peripheral vascular disease,  hypertension, low-grade papillary cancer of the bladder, chronic kidney disease stage IV, who was admitted to [facility] on 10/02/2018 for ongoing management.   1.  Acute renal failure/chronic kidney disease stage IV baseline EGFR 19.    Patient due for hemodialysis today.  Orders have been prepared.  Thereafter next dialysis treatment on Monday.  2.  Secondary hyperparathyroidism.  Phosphorus now down appropriately to 4.7.  Continue to monitor.  3.  Anemia chronic kidney disease.  Hemoglobin stable at 12.2.  Continue to monitor CBC.   LOS: 0 Gilbert White 11/1/20198:19 AM  [place], [place]  Note: This note was prepared with Dragon dictation. Any transcription errors are unintentional

## 2018-10-16 LAB — HEPARIN INDUCED PLATELET AB (HIT ANTIBODY): Heparin Induced Plt Ab: 0.308 OD (ref 0.000–0.400)

## 2018-10-18 ENCOUNTER — Other Ambulatory Visit (HOSPITAL_COMMUNITY): Payer: Medicare Other

## 2018-10-18 ENCOUNTER — Encounter (HOSPITAL_BASED_OUTPATIENT_CLINIC_OR_DEPARTMENT_OTHER): Payer: Medicare Other

## 2018-10-18 ENCOUNTER — Encounter (HOSPITAL_COMMUNITY): Payer: Self-pay | Admitting: Interventional Radiology

## 2018-10-18 DIAGNOSIS — Z01810 Encounter for preprocedural cardiovascular examination: Secondary | ICD-10-CM | POA: Diagnosis not present

## 2018-10-18 HISTORY — PX: IR FLUORO GUIDE CV LINE RIGHT: IMG2283

## 2018-10-18 LAB — CBC
HCT: 33.6 % — ABNORMAL LOW (ref 39.0–52.0)
Hemoglobin: 10.9 g/dL — ABNORMAL LOW (ref 13.0–17.0)
MCH: 27.3 pg (ref 26.0–34.0)
MCHC: 32.4 g/dL (ref 30.0–36.0)
MCV: 84 fL (ref 80.0–100.0)
NRBC: 0 % (ref 0.0–0.2)
PLATELETS: 128 10*3/uL — AB (ref 150–400)
RBC: 4 MIL/uL — AB (ref 4.22–5.81)
RDW: 16 % — ABNORMAL HIGH (ref 11.5–15.5)
WBC: 5.9 10*3/uL (ref 4.0–10.5)

## 2018-10-18 LAB — RENAL FUNCTION PANEL
Albumin: 2.6 g/dL — ABNORMAL LOW (ref 3.5–5.0)
Anion gap: 15 (ref 5–15)
BUN: 75 mg/dL — ABNORMAL HIGH (ref 8–23)
CALCIUM: 8.4 mg/dL — AB (ref 8.9–10.3)
CO2: 22 mmol/L (ref 22–32)
CREATININE: 9.77 mg/dL — AB (ref 0.61–1.24)
Chloride: 101 mmol/L (ref 98–111)
GFR, EST AFRICAN AMERICAN: 5 mL/min — AB (ref >60)
GFR, EST NON AFRICAN AMERICAN: 5 mL/min — AB (ref >60)
Glucose, Bld: 109 mg/dL — ABNORMAL HIGH (ref 70–99)
Phosphorus: 5.9 mg/dL — ABNORMAL HIGH (ref 2.5–4.6)
Potassium: 4.4 mmol/L (ref 3.5–5.1)
SODIUM: 138 mmol/L (ref 135–145)

## 2018-10-18 MED ORDER — LIDOCAINE HCL 1 % IJ SOLN
INTRAMUSCULAR | Status: DC | PRN
  Administered 2018-10-18: 15 mL

## 2018-10-18 MED ORDER — HEPARIN SODIUM (PORCINE) 1000 UNIT/ML IJ SOLN
INTRAMUSCULAR | Status: AC
  Administered 2018-10-18: 3.8 mL
  Filled 2018-10-18: qty 1

## 2018-10-18 MED ORDER — LIDOCAINE HCL 1 % IJ SOLN
INTRAMUSCULAR | Status: AC
  Filled 2018-10-18: qty 20

## 2018-10-18 MED ORDER — CHLORHEXIDINE GLUCONATE 4 % EX LIQD
CUTANEOUS | Status: AC
  Filled 2018-10-18: qty 15

## 2018-10-18 NOTE — Progress Notes (Signed)
  10/18/18  Subjective:  Patient agitated this a.m. Catheter was clotted on Friday. PermCath to be replaced today.   Objective:  Vital signs in last 24 hours:  Temperature 98 pulse 91 respirations 20 blood pressure 140/79   Physical Exam: General:  Agitated, laying in the bed  HEENT  moist oral mucous membranes  Neck  supple  Pulm/lungs  normal breathing effort, scattered rhonchi  CVS/Heart  irregular, atrial fibrillation  Abdomen:   Soft, mildly distended  Extremities:  No peripheral edema  Neurologic:  Alert, oriented, agitated  Skin:  No acute rashes  Access:  Right IJ PermCath       Basic Metabolic Panel:  Recent Labs  Lab 10/13/18 0648 10/15/18 0619  NA 138 137  K 4.6 4.1  CL 101 102  CO2 24 23  GLUCOSE 112* 108*  BUN 71* 66*  CREATININE 8.25* 8.27*  CALCIUM 8.3* 8.4*  PHOS 7.5* 4.7*     CBC: Recent Labs  Lab 10/13/18 0648 10/15/18 0619  WBC 9.0 7.8  HGB 12.3* 12.2*  HCT 39.0 37.1*  MCV 83.9 83.2  PLT 123* 93*      Lab Results  Component Value Date   HEPBSAG Negative 10/04/2018      Microbiology:  No results found for this or any previous visit (from the past 240 hour(s)).  Coagulation Studies: No results for input(s): LABPROT, INR in the last 72 hours.  Urinalysis: No results for input(s): COLORURINE, LABSPEC, PHURINE, GLUCOSEU, HGBUR, BILIRUBINUR, KETONESUR, PROTEINUR, UROBILINOGEN, NITRITE, LEUKOCYTESUR in the last 72 hours.  Invalid input(s): APPERANCEUR    Imaging: No results found.   Medications:       Assessment/ Plan:  72 y.o. male morbid obesity, tobacco abuse, obstructive sleep apnea, diabetes mellitus type 2, hypertension, hyperlipidemia, COPD, prostate cancer, peripheral vascular disease, hypertension, low-grade papillary cancer of the bladder, chronic kidney disease stage IV, who was admitted to [facility] on 10/02/2018 for ongoing management.   1.  Acute renal failure/chronic kidney disease stage IV  baseline EGFR 19.    PermCath needs to be changed today as it clotted during the last treatment.  This is on the schedule to be performed.  Thereafter we will plan to perform dialysis.  2.  Secondary hyperparathyroidism.  Recheck serum phosphorus later today.  3.  Anemia chronic kidney disease.  Recheck CBC today during dialysis.   LOS: 0 Althia Egolf 11/4/20198:12 AM  [place], [place]  Note: This note was prepared with Dragon dictation. Any transcription errors are unintentional

## 2018-10-18 NOTE — Procedures (Signed)
Interventional Radiology Procedure Note  Procedure: Dialysis catheter exchange  Complications: None  Estimated Blood Loss: < 10 mL  Findings: Occluded catheter removed over wire and new 23 cm tip to cuff length Palindrome catheter placed with tip at SVC/RA junction. OK to use.  Venetia Night. Kathlene Cote, M.D Pager:  [phone]

## 2018-10-18 NOTE — Progress Notes (Signed)
UE vein mapping prelim      Landry Mellow, RDMS, RVT

## 2018-10-18 NOTE — Progress Notes (Signed)
   Patient Status: [facility]  Assessment and Plan: Patient in need of venous access.   Has existing tunneled Rt IJ dialysis catheter Clotted per dialysis RN and [facility] MD Need exchange/replacement  ______________________________________________________________________   History of Present Illness: Gilbert White is a 72 y.o. male   A/CKD Admitted to [facility] with dialysis catheter in place Follows with Dr Holley Raring Noted dialysis catheter clogged Fri Now needs replacement/exchange  Allergies and medications reviewed.   Review of Systems: A 12 point ROS discussed and pertinent positives are indicated in the HPI above.  All other systems are negative.  Vital Signs: There were no vitals taken for this visit.  Physical Exam  Cardiovascular: Normal rate and regular rhythm.  Pulmonary/Chest: Effort normal and breath sounds normal.  Skin: Skin is warm and dry.  Psychiatric:  Consented with wife via phone  Vitals reviewed.    Imaging reviewed.   Labs:  COAGS: Recent Labs    10/03/18 0650  INR 1.19    BMP: Recent Labs    10/11/18 0713 10/13/18 0648 10/15/18 0619 10/18/18 0900  NA 138 138 137 138  K 3.8 4.6 4.1 4.4  CL 102 101 102 101  CO2 21* 24 23 22   GLUCOSE 87 112* 108* 109*  BUN 80* 71* 66* 75*  CALCIUM 8.4* 8.3* 8.4* 8.4*  CREATININE 8.92* 8.25* 8.27* 9.77*  GFRNONAA 5* 6* 6* 5*  GFRAA 6* 7* 7* 5*   Risks and benefits discussed with the patient's wife Janie via phone including, but not limited to bleeding, infection, vascular injury, pneumothorax which may require chest tube placement, air embolism or even death  All of her  questions were answered, she is agreeable to proceed. Consent signed and in chart.  Electronically Signed: Elizer Bostic A, PA-C 10/18/2018, 11:05 AM   I spent a total of 15 minutes in face to face in clinical consultation, greater than 50% of which was counseling/coordinating care for venous access.Patient ID: Gilbert White, male   DOB: [birth year], 72 y.o.   MRN: [id number]

## 2018-10-19 DIAGNOSIS — Z992 Dependence on renal dialysis: Secondary | ICD-10-CM | POA: Diagnosis not present

## 2018-10-19 DIAGNOSIS — N186 End stage renal disease: Secondary | ICD-10-CM

## 2018-10-19 NOTE — Consult Note (Addendum)
Hospital Consult    Reason for Consult:  Permanent dialysis access Requesting Physician:  Dr. Holley Raring MRN #:  [id number]  History of Present Illness: This is a 72 y.o. male with PMH significant for OSA, DM type II, HTN, hyperlipidemia, COPD, tobacco abuse, prostate cancer, papillary cancer of the bladder, PAD, and now ESRD on hemodialysis.  He is POD#1 s/p R IJ TDC exchange by IR.  He is seen in consultation to evaluate for permanent dialysis access.  He is a poor historian with persistent encephalopathic behavior per nursing staff.  No family present on exam.  When discussing the nature of dialysis access surgery the patient began perseverating about not having any surgery involving his arteries or veins while in [place].  He would like to go to [facility] for surgery related to his arteries.  Based on chart review, he has seen Vascular surgeon Dr. Vella Kohler in the past for R CFA and SFA stenosis.  He is not willing to discuss dialysis surgery any further despite several attempts.   Allergies not on file  Prior to Admission medications   Not on File    Social History   Socioeconomic History  . Marital status: Married    Spouse name: Not on file  . Number of children: Not on file  . Years of education: Not on file  . Highest education level: Not on file  Occupational History  . Not on file  Social Needs  . Financial resource strain: Not on file  . Food insecurity:    Worry: Not on file    Inability: Not on file  . Transportation needs:    Medical: Not on file    Non-medical: Not on file  Tobacco Use  . Smoking status: Not on file  Substance and Sexual Activity  . Alcohol use: Not on file  . Drug use: Not on file  . Sexual activity: Not on file  Lifestyle  . Physical activity:    Days per week: Not on file    Minutes per session: Not on file  . Stress: Not on file  Relationships  . Social connections:    Talks on phone: Not on file    Gets together: Not on file    Attends religious service: Not on file    Active member of club or organization: Not on file    Attends meetings of clubs or organizations: Not on file    Relationship status: Not on file  . Intimate partner violence:    Fear of current or ex partner: Not on file    Emotionally abused: Not on file    Physically abused: Not on file    Forced sexual activity: Not on file  Other Topics Concern  . Not on file  Social History Narrative  . Not on file     No family history on file.  ROS: Otherwise negative unless mentioned in HPI  Physical Examination  General:  WDWN in NAD Gait: Not observed HENT: WNL, normocephalic Pulmonary: normal non-labored breathing Abdomen:  soft, NT/ND, no masses Skin: without rashes Vascular Exam/Pulses: symmetrical radial pulses Extremities: socks left on during exam Musculoskeletal: no muscle wasting or atrophy  Neurologic: No focal weakness or paresthesias are detected; speech is fluent/normal Psychiatric:  Agitated Lymph:  Unremarkable  CBC    Component Value Date/Time   WBC 5.9 10/18/2018 0900   RBC 4.00 (L) 10/18/2018 0900   HGB 10.9 (L) 10/18/2018 0900   HCT 33.6 (L) 10/18/2018 0900  PLT 128 (L) 10/18/2018 0900   MCV 84.0 10/18/2018 0900   MCH 27.3 10/18/2018 0900   MCHC 32.4 10/18/2018 0900   RDW 16.0 (H) 10/18/2018 0900   LYMPHSABS 1.2 10/03/2018 0650   MONOABS 1.2 (H) 10/03/2018 0650   EOSABS 0.3 10/03/2018 0650   BASOSABS 0.0 10/03/2018 0650    BMET    Component Value Date/Time   NA 138 10/18/2018 0900   K 4.4 10/18/2018 0900   CL 101 10/18/2018 0900   CO2 22 10/18/2018 0900   GLUCOSE 109 (H) 10/18/2018 0900   BUN 75 (H) 10/18/2018 0900   CREATININE 9.77 (H) 10/18/2018 0900   CALCIUM 8.4 (L) 10/18/2018 0900   CALCIUM 8.6 10/08/2018 0724   GFRNONAA 5 (L) 10/18/2018 0900   GFRAA 5 (L) 10/18/2018 0900    COAGS: Lab Results  Component Value Date   INR 1.19 10/03/2018     Non-Invasive Vascular Imaging:       ASSESSMENT/PLAN: This is a 72 y.o. male with ESRD now on hemodialysis  Based on vein mapping, patient may be a candidate for a basilic vein fistula.  He was offered L arm AV fistula versus AV graft however he is addiment and perseverating about not having surgery here, and preference to have this surgery in [facility].  I will attempt to return later to further discuss when patient's wife gets to the hospital today.  Family, including wife, present on my return visit.  Patient is willing to have access surgery here in [place].  He however is under the impression that he will be leaving the hospital today and returning for surgery as an outpatient.  Surgery can potentially be scheduled for Thursday if he remains hospitalized.  If discharged, office will call to arrange as an outpatient.  Dr. Trula Slade will be evaluating the patient later today.   Dagoberto Ligas PA-C [facility] [phone]   I agree with the above.  I have seen and evaluated the patient.  We discussed proceeding with a left first stage basilic vein fistula creation versus a left arm graft.  He understands that if we proceed with a first stage basilic vein fistula that he would require a second operation.  He understands the risk of non-maturity and the need for future interventions.  We also discussed the risk of steal syndrome.  All of his questions were answered and he wishes to proceed.  We will schedule this for tomorrow, Thursday, November 7.  [name]

## 2018-10-19 NOTE — H&P (View-Only) (Signed)
Hospital Consult    Reason for Consult:  Permanent dialysis access Requesting Physician:  Dr. Holley Raring MRN #:  [id number]  History of Present Illness: This is a 72 y.o. male with PMH significant for OSA, DM type II, HTN, hyperlipidemia, COPD, tobacco abuse, prostate cancer, papillary cancer of the bladder, PAD, and now ESRD on hemodialysis.  He is POD#1 s/p R IJ TDC exchange by IR.  He is seen in consultation to evaluate for permanent dialysis access.  He is a poor historian with persistent encephalopathic behavior per nursing staff.  No family present on exam.  When discussing the nature of dialysis access surgery the patient began perseverating about not having any surgery involving his arteries or veins while in [place].  He would like to go to [facility] for surgery related to his arteries.  Based on chart review, he has seen Vascular surgeon Dr. Vella Kohler in the past for R CFA and SFA stenosis.  He is not willing to discuss dialysis surgery any further despite several attempts.   Allergies not on file  Prior to Admission medications   Not on File    Social History   Socioeconomic History  . Marital status: Married    Spouse name: Not on file  . Number of children: Not on file  . Years of education: Not on file  . Highest education level: Not on file  Occupational History  . Not on file  Social Needs  . Financial resource strain: Not on file  . Food insecurity:    Worry: Not on file    Inability: Not on file  . Transportation needs:    Medical: Not on file    Non-medical: Not on file  Tobacco Use  . Smoking status: Not on file  Substance and Sexual Activity  . Alcohol use: Not on file  . Drug use: Not on file  . Sexual activity: Not on file  Lifestyle  . Physical activity:    Days per week: Not on file    Minutes per session: Not on file  . Stress: Not on file  Relationships  . Social connections:    Talks on phone: Not on file    Gets together: Not on file    Attends religious service: Not on file    Active member of club or organization: Not on file    Attends meetings of clubs or organizations: Not on file    Relationship status: Not on file  . Intimate partner violence:    Fear of current or ex partner: Not on file    Emotionally abused: Not on file    Physically abused: Not on file    Forced sexual activity: Not on file  Other Topics Concern  . Not on file  Social History Narrative  . Not on file     No family history on file.  ROS: Otherwise negative unless mentioned in HPI  Physical Examination  General:  WDWN in NAD Gait: Not observed HENT: WNL, normocephalic Pulmonary: normal non-labored breathing Abdomen:  soft, NT/ND, no masses Skin: without rashes Vascular Exam/Pulses: symmetrical radial pulses Extremities: socks left on during exam Musculoskeletal: no muscle wasting or atrophy  Neurologic: No focal weakness or paresthesias are detected; speech is fluent/normal Psychiatric:  Agitated Lymph:  Unremarkable  CBC    Component Value Date/Time   WBC 5.9 10/18/2018 0900   RBC 4.00 (L) 10/18/2018 0900   HGB 10.9 (L) 10/18/2018 0900   HCT 33.6 (L) 10/18/2018 0900  PLT 128 (L) 10/18/2018 0900   MCV 84.0 10/18/2018 0900   MCH 27.3 10/18/2018 0900   MCHC 32.4 10/18/2018 0900   RDW 16.0 (H) 10/18/2018 0900   LYMPHSABS 1.2 10/03/2018 0650   MONOABS 1.2 (H) 10/03/2018 0650   EOSABS 0.3 10/03/2018 0650   BASOSABS 0.0 10/03/2018 0650    BMET    Component Value Date/Time   NA 138 10/18/2018 0900   K 4.4 10/18/2018 0900   CL 101 10/18/2018 0900   CO2 22 10/18/2018 0900   GLUCOSE 109 (H) 10/18/2018 0900   BUN 75 (H) 10/18/2018 0900   CREATININE 9.77 (H) 10/18/2018 0900   CALCIUM 8.4 (L) 10/18/2018 0900   CALCIUM 8.6 10/08/2018 0724   GFRNONAA 5 (L) 10/18/2018 0900   GFRAA 5 (L) 10/18/2018 0900    COAGS: Lab Results  Component Value Date   INR 1.19 10/03/2018     Non-Invasive Vascular Imaging:       ASSESSMENT/PLAN: This is a 72 y.o. male with ESRD now on hemodialysis  Based on vein mapping, patient may be a candidate for a basilic vein fistula.  He was offered L arm AV fistula versus AV graft however he is addiment and perseverating about not having surgery here, and preference to have this surgery in [facility].  I will attempt to return later to further discuss when patient's wife gets to the hospital today.  Family, including wife, present on my return visit.  Patient is willing to have access surgery here in [place].  He however is under the impression that he will be leaving the hospital today and returning for surgery as an outpatient.  Surgery can potentially be scheduled for Thursday if he remains hospitalized.  If discharged, office will call to arrange as an outpatient.  Dr. Trula Slade will be evaluating the patient later today.   Dagoberto Ligas PA-C [facility] [phone]   I agree with the above.  I have seen and evaluated the patient.  We discussed proceeding with a left first stage basilic vein fistula creation versus a left arm graft.  He understands that if we proceed with a first stage basilic vein fistula that he would require a second operation.  He understands the risk of non-maturity and the need for future interventions.  We also discussed the risk of steal syndrome.  All of his questions were answered and he wishes to proceed.  We will schedule this for tomorrow, Thursday, November 7.  [name]

## 2018-10-20 LAB — PROTIME-INR
INR: 1.04
Prothrombin Time: 13.5 seconds (ref 11.4–15.2)

## 2018-10-20 LAB — RENAL FUNCTION PANEL
ALBUMIN: 2.5 g/dL — AB (ref 3.5–5.0)
ANION GAP: 15 (ref 5–15)
BUN: 71 mg/dL — ABNORMAL HIGH (ref 8–23)
CALCIUM: 8.2 mg/dL — AB (ref 8.9–10.3)
CO2: 18 mmol/L — AB (ref 22–32)
Chloride: 105 mmol/L (ref 98–111)
Creatinine, Ser: 8.89 mg/dL — ABNORMAL HIGH (ref 0.61–1.24)
GFR calc Af Amer: 6 mL/min — ABNORMAL LOW (ref >60)
GFR calc non Af Amer: 5 mL/min — ABNORMAL LOW (ref >60)
GLUCOSE: 109 mg/dL — AB (ref 70–99)
POTASSIUM: 3.6 mmol/L (ref 3.5–5.1)
Phosphorus: 4.7 mg/dL — ABNORMAL HIGH (ref 2.5–4.6)
Sodium: 138 mmol/L (ref 135–145)

## 2018-10-20 LAB — CBC
HEMATOCRIT: 33.6 % — AB (ref 39.0–52.0)
HEMOGLOBIN: 10.4 g/dL — AB (ref 13.0–17.0)
MCH: 26.5 pg (ref 26.0–34.0)
MCHC: 31 g/dL (ref 30.0–36.0)
MCV: 85.5 fL (ref 80.0–100.0)
Platelets: 144 10*3/uL — ABNORMAL LOW (ref 150–400)
RBC: 3.93 MIL/uL — ABNORMAL LOW (ref 4.22–5.81)
RDW: 16.2 % — ABNORMAL HIGH (ref 11.5–15.5)
WBC: 6 10*3/uL (ref 4.0–10.5)
nRBC: 0 % (ref 0.0–0.2)

## 2018-10-20 NOTE — Progress Notes (Signed)
10/20/18  Subjective:  Patient sitting up in chair. Due for dialysis later today.    Objective:  Vital signs in last 24 hours:  Temperature 97 pulse 86 respirations 21 blood pressure 147/72  Physical Exam: General:  Agitated, laying in the bed  HEENT  moist oral mucous membranes  Neck  supple  Pulm/lungs  normal breathing effort, scattered rhonchi  CVS/Heart  irregular, atrial fibrillation  Abdomen:   Soft, mildly distended  Extremities:  No peripheral edema  Neurologic:  Alert, oriented, agitated  Skin:  No acute rashes  Access:  Right IJ PermCath       Basic Metabolic Panel:  Recent Labs  Lab 10/15/18 0619 10/18/18 0900 10/20/18 0517  NA 137 138 138  K 4.1 4.4 3.6  CL 102 101 105  CO2 23 22 18*  GLUCOSE 108* 109* 109*  BUN 66* 75* 71*  CREATININE 8.27* 9.77* 8.89*  CALCIUM 8.4* 8.4* 8.2*  PHOS 4.7* 5.9* 4.7*     CBC: Recent Labs  Lab 10/15/18 0619 10/18/18 0900 10/20/18 0517  WBC 7.8 5.9 6.0  HGB 12.2* 10.9* 10.4*  HCT 37.1* 33.6* 33.6*  MCV 83.2 84.0 85.5  PLT 93* 128* 144*      Lab Results  Component Value Date   HEPBSAG Negative 10/04/2018      Microbiology:  No results found for this or any previous visit (from the past 240 hour(s)).  Coagulation Studies: Recent Labs    10/20/18 0517  LABPROT 13.5  INR 1.04    Urinalysis: No results for input(s): COLORURINE, LABSPEC, PHURINE, GLUCOSEU, HGBUR, BILIRUBINUR, KETONESUR, PROTEINUR, UROBILINOGEN, NITRITE, LEUKOCYTESUR in the last 72 hours.  Invalid input(s): APPERANCEUR    Imaging: Ir Fluoro Guide Cv Line Right  Result Date: 10/18/2018 CLINICAL DATA:  Occluded indwelling right-sided tunneled dialysis catheter. EXAM: EXCHANGE OF TUNNELED CENTRAL VENOUS HEMODIALYSIS CATHETER UNDER FLUOROSCOPIC GUIDANCE ANESTHESIA/SEDATION: None MEDICATIONS: None FLUOROSCOPY TIME:  36 seconds.  4.5 mGy. PROCEDURE: The procedure, risks, benefits, and alternatives were explained to the patient.  Questions regarding the procedure were encouraged and answered. The patient understands and consents to the procedure. A time-out was performed prior to initiating the procedure. The preexisting right-sided dialysis catheter and surrounding skin were prepped with chlorhexidine in a sterile fashion, and a sterile drape was applied covering the operative field. Maximum barrier sterile technique with sterile gowns and gloves were used for the procedure. Local anesthesia was provided with 1% lidocaine. The preexisting dialysis catheter was removed over a guidewire after freeing the subcutaneous cuff using blunt dissection. A new Palindrome tunneled hemodialysis catheter measuring 23 cm from tip to cuff was chosen for placement. This was advanced over the guidewire under fluoroscopy. Final catheter positioning was confirmed and documented with a fluoroscopic spot image. The catheter was aspirated, flushed with saline, and injected with appropriate volume heparin dwells. COMPLICATIONS: None.  No pneumothorax. FINDINGS: After catheter placement, the tip lies at the SVC/RA junction. The catheter aspirates normally and is ready for immediate use. IMPRESSION: Exchange of tunneled hemodialysis catheter via the right internal jugular vein. The new catheter tip lies at the SVC/RA junction. The catheter is ready for immediate use. Electronically Signed   By: Aletta Edouard M.D.   On: 10/18/2018 16:36   Vas [place] Upper Ext Vein Mapping (pre-op Avf)  Result Date: 10/18/2018 UPPER EXTREMITY VEIN MAPPING  Indications: Pre-access. Performing Technologist: Landry Mellow RDMS, RVT  Examination Guidelines: A complete evaluation includes B-mode imaging, spectral Doppler, color Doppler, and power Doppler as needed of  all accessible portions of each vessel. Bilateral testing is considered an integral part of a complete examination. Limited examinations for reoccurring indications may be performed as noted.  +-----------------+-------------+----------+--------+ Right Cephalic   Diameter (cm)Depth (cm)Findings +-----------------+-------------+----------+--------+ Shoulder             0.11                        +-----------------+-------------+----------+--------+ Mid upper arm        0.11        0.47            +-----------------+-------------+----------+--------+ Dist upper arm       0.23        0.23            +-----------------+-------------+----------+--------+ Antecubital fossa    0.28                        +-----------------+-------------+----------+--------+ Prox forearm         0.18                        +-----------------+-------------+----------+--------+ +-----------------+-------------+----------+---------+ Right Basilic    Diameter (cm)Depth (cm)Findings  +-----------------+-------------+----------+---------+ Mid upper arm        0.28        1.78             +-----------------+-------------+----------+---------+ Dist upper arm       0.19        0.97   branching +-----------------+-------------+----------+---------+ Antecubital fossa    0.13        0.62   branching +-----------------+-------------+----------+---------+ difficult study due to poor patient cooperation  +-----------------+-------------+----------+--------------+ Left Cephalic    Diameter (cm)Depth (cm)   Findings    +-----------------+-------------+----------+--------------+ Mid upper arm        0.11        0.47                  +-----------------+-------------+----------+--------------+ Dist upper arm       0.22        0.53                  +-----------------+-------------+----------+--------------+ Antecubital fossa    0.20                              +-----------------+-------------+----------+--------------+ Prox forearm                            not visualized +-----------------+-------------+----------+--------------+  +-----------------+-------------+----------+--------------+ Left Basilic     Diameter (cm)Depth (cm)   Findings    +-----------------+-------------+----------+--------------+ Mid upper arm        0.32        2.34                  +-----------------+-------------+----------+--------------+ Dist upper arm       0.30        1.36                  +-----------------+-------------+----------+--------------+ Antecubital fossa    0.26        0.60     branching    +-----------------+-------------+----------+--------------+ Prox forearm                            not visualized +-----------------+-------------+----------+--------------+ difficult study due to poor  patient cooperation  *See table(s) above for measurements and observations.  Diagnosing physician: Ruta Hinds MD Electronically signed by Ruta Hinds MD on 10/18/2018 at 6:51:43 PM.    Final      Medications:       Assessment/ Plan:  72 y.o. male morbid obesity, tobacco abuse, obstructive sleep apnea, diabetes mellitus type 2, hypertension, hyperlipidemia, COPD, prostate cancer, peripheral vascular disease, hypertension, low-grade papillary cancer of the bladder, chronic kidney disease stage IV, who was admitted [facility] 10[facility]2018 for ongoing management.   1.  Acute renal failure/chronic kidney disease stage IV baseline EGFR 19.    Patient remains dialysis dependent at this time.  Appreciate vascular surgery assistance.  He will likely have fistula placed on Thursday.  Patient due for dialysis later today.  2.  Secondary hyperparathyroidism.  Phosphorus down to 4.7 and at target.  3.  Anemia chronic kidney disease.  Hemoglobin currently 10.4 and acceptable.  Continue to periodically monitor CBC.   LOS: 0 Zaylia Riolo 11/6/201911:06 AM  [place], [place]  Note: This note was prepared with Dragon dictation. Any transcription errors are unintentional

## 2018-10-21 ENCOUNTER — Encounter (HOSPITAL_COMMUNITY): Payer: Medicare Other | Admitting: Certified Registered"

## 2018-10-21 ENCOUNTER — Inpatient Hospital Stay
Admission: AD | Admit: 2018-10-21 | Discharge: 2018-11-02 | Disposition: A | Discharge status: Home Health | Payer: Medicare Other | Source: Ambulatory Visit | Attending: Internal Medicine | Admitting: Internal Medicine

## 2018-10-21 ENCOUNTER — Encounter (HOSPITAL_COMMUNITY)
Admission: AD | Disposition: A | Discharge status: Other Acute Inpatient Hospital | Payer: Self-pay | Source: Other Acute Inpatient Hospital | Attending: Internal Medicine

## 2018-10-21 ENCOUNTER — Encounter (HOSPITAL_COMMUNITY): Payer: Self-pay

## 2018-10-21 DIAGNOSIS — Z992 Dependence on renal dialysis: Secondary | ICD-10-CM | POA: Diagnosis not present

## 2018-10-21 DIAGNOSIS — N186 End stage renal disease: Secondary | ICD-10-CM | POA: Diagnosis not present

## 2018-10-21 HISTORY — PX: AV FISTULA PLACEMENT: SHX1204

## 2018-10-21 LAB — CBC
HCT: 31.2 % — ABNORMAL LOW (ref 39.0–52.0)
HEMOGLOBIN: 9.9 g/dL — AB (ref 13.0–17.0)
MCH: 27 pg (ref 26.0–34.0)
MCHC: 31.7 g/dL (ref 30.0–36.0)
MCV: 85 fL (ref 80.0–100.0)
NRBC: 0 % (ref 0.0–0.2)
Platelets: 147 10*3/uL — ABNORMAL LOW (ref 150–400)
RBC: 3.67 MIL/uL — AB (ref 4.22–5.81)
RDW: 16.8 % — ABNORMAL HIGH (ref 11.5–15.5)
WBC: 5.7 10*3/uL (ref 4.0–10.5)

## 2018-10-21 LAB — GLUCOSE, CAPILLARY
GLUCOSE-CAPILLARY: 131 mg/dL — AB (ref 70–99)
Glucose-Capillary: 135 mg/dL — ABNORMAL HIGH (ref 70–99)

## 2018-10-21 SURGERY — ARTERIOVENOUS (AV) FISTULA CREATION
Anesthesia: General | Site: Arm Upper | Laterality: Left

## 2018-10-21 MED ORDER — SODIUM CHLORIDE 0.9 % IV SOLN
INTRAVENOUS | Status: DC
  Administered 2018-10-21: 09:00:00 via INTRAVENOUS

## 2018-10-21 MED ORDER — ONDANSETRON HCL 4 MG/2ML IJ SOLN
INTRAMUSCULAR | Status: DC | PRN
  Administered 2018-10-21: 4 mg via INTRAVENOUS

## 2018-10-21 MED ORDER — OXYCODONE HCL 5 MG PO TABS
ORAL_TABLET | ORAL | Status: AC
  Administered 2018-10-21: 5 mg via ORAL
  Filled 2018-10-21: qty 1

## 2018-10-21 MED ORDER — LIDOCAINE HCL (PF) 1 % IJ SOLN
INTRAMUSCULAR | Status: AC
  Filled 2018-10-21: qty 30

## 2018-10-21 MED ORDER — 0.9 % SODIUM CHLORIDE (POUR BTL) OPTIME
TOPICAL | Status: DC | PRN
  Administered 2018-10-21: 1000 mL

## 2018-10-21 MED ORDER — CEFAZOLIN SODIUM-DEXTROSE 2-4 GM/100ML-% IV SOLN
INTRAVENOUS | Status: AC
  Filled 2018-10-21: qty 100

## 2018-10-21 MED ORDER — FENTANYL CITRATE (PF) 250 MCG/5ML IJ SOLN
INTRAMUSCULAR | Status: AC
  Filled 2018-10-21: qty 5

## 2018-10-21 MED ORDER — PHENYLEPHRINE 40 MCG/ML (10ML) SYRINGE FOR IV PUSH (FOR BLOOD PRESSURE SUPPORT)
PREFILLED_SYRINGE | INTRAVENOUS | Status: AC
  Filled 2018-10-21: qty 10

## 2018-10-21 MED ORDER — ONDANSETRON HCL 4 MG/2ML IJ SOLN
INTRAMUSCULAR | Status: AC
  Filled 2018-10-21: qty 2

## 2018-10-21 MED ORDER — PHENYLEPHRINE HCL 10 MG/ML IJ SOLN
INTRAMUSCULAR | Status: DC | PRN
  Administered 2018-10-21: 120 ug via INTRAVENOUS
  Administered 2018-10-21: 40 ug via INTRAVENOUS
  Administered 2018-10-21: 120 ug via INTRAVENOUS

## 2018-10-21 MED ORDER — SODIUM CHLORIDE 0.9 % IV SOLN
INTRAVENOUS | Status: DC | PRN
  Administered 2018-10-21: 09:00:00

## 2018-10-21 MED ORDER — HYDROCODONE-ACETAMINOPHEN 5-325 MG PO TABS: 1.0000 | ORAL_TABLET | Freq: Four times a day (QID) | ORAL | 0 refills | Status: DC | PRN

## 2018-10-21 MED ORDER — PROPOFOL 10 MG/ML IV BOLUS
INTRAVENOUS | Status: DC | PRN
  Administered 2018-10-21: 150 mg via INTRAVENOUS

## 2018-10-21 MED ORDER — OXYCODONE HCL 5 MG/5ML PO SOLN: 5.0000 mg | Freq: Once | ORAL | Status: AC | PRN

## 2018-10-21 MED ORDER — OXYCODONE HCL 5 MG PO TABS
5.0000 mg | ORAL_TABLET | Freq: Once | ORAL | Status: AC | PRN
  Administered 2018-10-21: 5 mg via ORAL

## 2018-10-21 MED ORDER — LIDOCAINE HCL (CARDIAC) PF 100 MG/5ML IV SOSY
PREFILLED_SYRINGE | INTRAVENOUS | Status: DC | PRN
  Administered 2018-10-21: 40 mg via INTRAVENOUS

## 2018-10-21 MED ORDER — HEPARIN SODIUM (PORCINE) 1000 UNIT/ML IJ SOLN
INTRAMUSCULAR | Status: DC | PRN
  Administered 2018-10-21: 3000 [IU] via INTRAVENOUS

## 2018-10-21 MED ORDER — PROTAMINE SULFATE 10 MG/ML IV SOLN
INTRAVENOUS | Status: DC | PRN
  Administered 2018-10-21: 15 mg via INTRAVENOUS
  Administered 2018-10-21: 10 mg via INTRAVENOUS

## 2018-10-21 MED ORDER — EPHEDRINE SULFATE 50 MG/ML IJ SOLN
INTRAMUSCULAR | Status: DC | PRN
  Administered 2018-10-21: 10 mg via INTRAVENOUS
  Administered 2018-10-21: 5 mg via INTRAVENOUS

## 2018-10-21 MED ORDER — CEFAZOLIN SODIUM-DEXTROSE 2-3 GM-%(50ML) IV SOLR
INTRAVENOUS | Status: DC | PRN
  Administered 2018-10-21: 2 g via INTRAVENOUS

## 2018-10-21 MED ORDER — DEXAMETHASONE SODIUM PHOSPHATE 10 MG/ML IJ SOLN
INTRAMUSCULAR | Status: DC | PRN
  Administered 2018-10-21: 10 mg via INTRAVENOUS

## 2018-10-21 MED ORDER — FENTANYL CITRATE (PF) 100 MCG/2ML IJ SOLN: 25.0000 ug | INTRAMUSCULAR | Status: DC | PRN

## 2018-10-21 MED ORDER — FENTANYL CITRATE (PF) 250 MCG/5ML IJ SOLN
INTRAMUSCULAR | Status: DC | PRN
  Administered 2018-10-21 (×2): 50 ug via INTRAVENOUS

## 2018-10-21 MED ORDER — LIDOCAINE-EPINEPHRINE (PF) 1 %-1:200000 IJ SOLN
INTRAMUSCULAR | Status: AC
  Filled 2018-10-21: qty 30

## 2018-10-21 MED ORDER — SODIUM CHLORIDE 0.9 % IV SOLN
INTRAVENOUS | Status: DC | PRN
  Administered 2018-10-21: 60 ug/min via INTRAVENOUS

## 2018-10-21 MED ORDER — PROPOFOL 10 MG/ML IV BOLUS
INTRAVENOUS | Status: AC
  Filled 2018-10-21: qty 20

## 2018-10-21 MED ORDER — SODIUM CHLORIDE 0.9 % IV SOLN
INTRAVENOUS | Status: AC
  Filled 2018-10-21: qty 1.2

## 2018-10-21 MED ORDER — ONDANSETRON HCL 4 MG/2ML IJ SOLN: 4.0000 mg | Freq: Four times a day (QID) | INTRAMUSCULAR | Status: DC | PRN

## 2018-10-21 SURGICAL SUPPLY — 37 items
ARMBAND PINK RESTRICT EXTREMIT (MISCELLANEOUS) ×4 IMPLANT
CANISTER SUCT 3000ML PPV (MISCELLANEOUS) ×2 IMPLANT
CLIP VESOCCLUDE MED 6/CT (CLIP) ×2 IMPLANT
CLIP VESOCCLUDE SM WIDE 6/CT (CLIP) ×2 IMPLANT
COVER PROBE W GEL 5X96 (DRAPES) ×2 IMPLANT
COVER SURGICAL LIGHT HANDLE (MISCELLANEOUS) ×2 IMPLANT
COVER WAND RF STERILE (DRAPES) ×2 IMPLANT
DERMABOND ADVANCED (GAUZE/BANDAGES/DRESSINGS) ×1
DERMABOND ADVANCED .7 DNX12 (GAUZE/BANDAGES/DRESSINGS) ×1 IMPLANT
ELECT REM PT RETURN 9FT ADLT (ELECTROSURGICAL) ×2
ELECTRODE REM PT RTRN 9FT ADLT (ELECTROSURGICAL) ×1 IMPLANT
GLOVE BIO SURGEON STRL SZ 6.5 (GLOVE) ×2 IMPLANT
GLOVE BIO SURGEON STRL SZ7.5 (GLOVE) ×2 IMPLANT
GLOVE BIOGEL PI IND STRL 6.5 (GLOVE) ×1 IMPLANT
GLOVE BIOGEL PI IND STRL 7.0 (GLOVE) ×1 IMPLANT
GLOVE BIOGEL PI IND STRL 7.5 (GLOVE) ×1 IMPLANT
GLOVE BIOGEL PI INDICATOR 6.5 (GLOVE) ×1
GLOVE BIOGEL PI INDICATOR 7.0 (GLOVE) ×1
GLOVE BIOGEL PI INDICATOR 7.5 (GLOVE) ×1
GLOVE SURG SS PI 7.5 STRL IVOR (GLOVE) ×2 IMPLANT
GOWN STRL REUS W/ TWL LRG LVL3 (GOWN DISPOSABLE) ×2 IMPLANT
GOWN STRL REUS W/ TWL XL LVL3 (GOWN DISPOSABLE) ×1 IMPLANT
GOWN STRL REUS W/TWL LRG LVL3 (GOWN DISPOSABLE) ×2
GOWN STRL REUS W/TWL XL LVL3 (GOWN DISPOSABLE) ×1
HEMOSTAT SNOW SURGICEL 2X4 (HEMOSTASIS) IMPLANT
KIT BASIN OR (CUSTOM PROCEDURE TRAY) ×2 IMPLANT
KIT TURNOVER KIT B (KITS) ×2 IMPLANT
NS IRRIG 1000ML POUR BTL (IV SOLUTION) ×2 IMPLANT
PACK CV ACCESS (CUSTOM PROCEDURE TRAY) ×2 IMPLANT
PAD ARMBOARD 7.5X6 YLW CONV (MISCELLANEOUS) ×4 IMPLANT
SUT PROLENE 6 0 CC (SUTURE) ×2 IMPLANT
SUT VIC AB 3-0 SH 27 (SUTURE) ×1
SUT VIC AB 3-0 SH 27X BRD (SUTURE) ×1 IMPLANT
SUT VICRYL 4-0 PS2 18IN ABS (SUTURE) ×2 IMPLANT
TOWEL GREEN STERILE (TOWEL DISPOSABLE) ×2 IMPLANT
UNDERPAD 30X30 (UNDERPADS AND DIAPERS) ×2 IMPLANT
WATER STERILE IRR 1000ML POUR (IV SOLUTION) ×2 IMPLANT

## 2018-10-21 NOTE — Transfer of Care (Signed)
Immediate Anesthesia Transfer of Care Note  Patient: Gilbert White  Procedure(s) Performed: ARTERIOVENOUS (AV) FISTULA CREATION VERSUS GRAFT LEFT ARM (Left Arm Upper)  Patient Location: PACU  Anesthesia Type:General  Level of Consciousness: awake, alert  and oriented  Airway & Oxygen Therapy: Patient Spontanous Breathing and Patient connected to nasal cannula oxygen  Post-op Assessment: Report given to RN and Post -op Vital signs reviewed and stable  Post vital signs: Reviewed and stable  Last Vitals:  Vitals Value Taken Time  BP 125/106 10/21/2018 10:43 AM  Temp    Pulse 86 10/21/2018 10:46 AM  Resp 9 10/21/2018 10:46 AM  SpO2 96 % 10/21/2018 10:46 AM  Vitals shown include unvalidated device data.  Last Pain: There were no vitals filed for this visit.       Complications: No apparent anesthesia complications

## 2018-10-21 NOTE — Discharge Instructions (Signed)
° °  [facility] ° °Discharge Instructions ° °AV Fistula or Graft Surgery for Dialysis Access ° °Please refer to the following instructions for your post-procedure care. Your surgeon or physician assistant will discuss any changes with you. ° °Activity ° °You may drive the day following your surgery, if you are comfortable and no longer taking prescription pain medication. Resume full activity as the soreness in your incision resolves. ° °Bathing/Showering ° °You may shower after you go home. Keep your incision dry for 48 hours. Do not soak in a bathtub, hot tub, or swim until the incision heals completely. You may not shower if you have a hemodialysis catheter. ° °Incision Care ° °Clean your incision with mild soap and water after 48 hours. Pat the area dry with a clean towel. You do not need a bandage unless otherwise instructed. Do not apply any ointments or creams to your incision. You may have skin glue on your incision. Do not peel it off. It will come off on its own in about one week. Your arm may swell a bit after surgery. To reduce swelling use pillows to elevate your arm so it is above your heart. Your doctor will tell you if you need to lightly wrap your arm with an ACE bandage. ° °Diet ° °Resume your normal diet. There are not special food restrictions following this procedure. In order to heal from your surgery, it is CRITICAL to get adequate nutrition. Your body requires vitamins, minerals, and protein. Vegetables are the best source of vitamins and minerals. Vegetables also provide the perfect balance of protein. Processed food has little nutritional value, so try to avoid this. ° °Medications ° °Resume taking all of your medications. If your incision is causing pain, you may take over-the counter pain relievers such as acetaminophen (Tylenol). If you were prescribed a stronger pain medication, please be aware these medications can cause nausea and constipation. Prevent  nausea by taking the medication with a snack or meal. Avoid constipation by drinking plenty of fluids and eating foods with high amount of fiber, such as fruits, vegetables, and grains. Do not take Tylenol if you are taking prescription pain medications. ° ° ° ° °Follow up °Your surgeon may want to see you in the office following your access surgery. If so, this will be arranged at the time of your surgery. ° °Please call us immediately for any of the following conditions: ° °Increased pain, redness, drainage (pus) from your incision site °Fever of 101 degrees or higher °Severe or worsening pain at your incision site °Hand pain or numbness. ° °Reduce your risk of vascular disease: ° °Stop smoking. If you would like help, call [facility] at 1-800-QUIT-NOW (1-800-784-8669) or [facility] at 336-586-4000 ° °Manage your cholesterol °Maintain a desired weight °Control your diabetes °Keep your blood pressure down ° °Dialysis ° °It will take several weeks to several months for your new dialysis access to be ready for use. Your surgeon will determine when it is OK to use it. Your nephrologist will continue to direct your dialysis. You can continue to use your Permcath until your new access is ready for use. ° °If you have any questions, please call the office at [phone]. ° °

## 2018-10-21 NOTE — Op Note (Signed)
    Patient name: Gilbert White MRN: [id number] DOB: 12-23-1945 Sex: male  10/21/2018 Pre-operative Diagnosis: ESRD Post-operative diagnosis:  Same Surgeon:  [name] Assistants:  Arlee Muslim Procedure:   Left first stage basilic vein fistula Anesthesia: General Blood Loss: Minimal Specimens: None  Findings: 5 mm brachial artery with thickened intima.  The basilic vein measured about 4 mm.  There was chronic thrombus within the vein which was able to be easily removed.  Indications: Patient is on hemodialysis via a catheter.  Request has been made for fistula creation.  Procedure:  The patient was identified in the holding area and taken to [facility]  The patient was then placed supine on the table. general anesthesia was administered.  The patient was prepped and draped in the usual sterile fashion.  A time out was called and antibiotics were administered.  Ultrasound was used to map the course of the basilic vein in the upper arm.  This was a adequate sized vein measuring about 4 mm.  The brachial artery was also about 4 to 5 mm.  A transverse incision was made at the antecubital crease.  I first dissected out the brachial artery.  This was a 4-5 mm artery.  It was encircled with Vesseloops proximally and distally.  I then dissected out the basilic vein.  Several branches were ligated between silk ties.  The vein appeared to be of adequate caliber measuring at least 4 mm.  The vein was marked for orientation and then ligated distally.  It distended nicely with heparin saline.  3000 units of heparin was given.  After the heparin circulated, the brachial artery was occluded with vascular clamps.  A #11 blade was used to make an arteriotomy which was extended longitudinally with Potts scissors.  The patient did have thickened intima within the artery.  I then cut the vein to the appropriate length and spatulated to fit the size of the arteriotomy.  There did appear to be chronic thrombus  extending out from 1 of the branches that have been ligated.  I was able to manually remove the thrombus which only extended for approximately 1 cm into the vein.  I felt that the vein was going to be adequate for fistula creation.  A running anastomosis and a end-to-side fashion was then created with 6-0 Prolene.  Prior to completion, the appropriate flushing maneuvers were performed and the anastomosis was completed.  The vein distended nicely.  I inspected the course of the vein to make sure there were no kinks.  There was an excellent thrill within the fistula.  The patient had brisk radial and ulnar Doppler signals.  25 mg of protamine was given.  Once hemostasis was satisfactory, the incision was closed with 2 layers of 3-0 Vicryl followed by Dermabond.  There were no immediate complications.  The patient was taken to recovery room in stable condition.   Disposition: To PACU stable   V. [name], M.D. [facility] Office: [phone] Pager:  [phone]

## 2018-10-21 NOTE — Anesthesia Postprocedure Evaluation (Signed)
Anesthesia Post Note  Patient: Zelig Gacek  Procedure(s) Performed: ARTERIOVENOUS (AV) FISTULA CREATION VERSUS GRAFT LEFT ARM (Left Arm Upper)     Patient location during evaluation: PACU Anesthesia Type: General Level of consciousness: awake and alert Pain management: pain level controlled Vital Signs Assessment: post-procedure vital signs reviewed and stable Respiratory status: spontaneous breathing, nonlabored ventilation, respiratory function stable and patient connected to nasal cannula oxygen Cardiovascular status: blood pressure returned to baseline and stable Postop Assessment: no apparent nausea or vomiting Anesthetic complications: no    Last Vitals:  Vitals:   10/21/18 1055 10/21/18 1110  BP: 103/62 112/65  Pulse: 93 94  Resp: 15 16  Temp:  (!) 36.2 C  SpO2: 97% 95%    Last Pain:  Vitals:   10/21/18 1110  PainSc: 5                  Kimaria Struthers S

## 2018-10-21 NOTE — Anesthesia Procedure Notes (Signed)
Procedure Name: LMA Insertion Date/Time: 10/21/2018 9:48 AM Performed by: Teressa Lower., CRNA Pre-anesthesia Checklist: Patient identified, Emergency Drugs available, Suction available, Patient being monitored and Timeout performed Patient Re-evaluated:Patient Re-evaluated prior to induction Oxygen Delivery Method: Circle system utilized Preoxygenation: Pre-oxygenation with 100% oxygen Induction Type: IV induction Ventilation: Mask ventilation without difficulty LMA: LMA inserted LMA Size: 5.0 Number of attempts: 1 Placement Confirmation: positive ETCO2 and breath sounds checked- equal and bilateral Tube secured with: Tape Dental Injury: Teeth and Oropharynx as per pre-operative assessment

## 2018-10-21 NOTE — Anesthesia Preprocedure Evaluation (Signed)
Anesthesia Evaluation  Patient identified by MRN, date of birth, ID band Patient awake    Reviewed: Allergy & Precautions, H&P , NPO status , Patient's Chart, lab work & pertinent test results  Airway Mallampati: II   Neck ROM: full    Dental   Pulmonary sleep apnea , COPD, Current Smoker,    breath sounds clear to auscultation       Cardiovascular hypertension,  Rhythm:regular Rate:Normal     Neuro/Psych    GI/Hepatic   Endo/Other  diabetes, Type 2  Renal/GU ESRF and DialysisRenal disease     Musculoskeletal   Abdominal   Peds  Hematology  (+) anemia ,   Anesthesia Other Findings   Reproductive/Obstetrics                             Anesthesia Physical Anesthesia Plan  ASA: III  Anesthesia Plan: General   Post-op Pain Management:    Induction: Intravenous  PONV Risk Score and Plan: 2 and Ondansetron, Treatment may vary due to age or medical condition and Midazolam  Airway Management Planned: LMA  Additional Equipment:   Intra-op Plan:   Post-operative Plan:   Informed Consent: I have reviewed the patients History and Physical, chart, labs and discussed the procedure including the risks, benefits and alternatives for the proposed anesthesia with the patient or authorized representative who has indicated his/her understanding and acceptance.     Plan Discussed with: CRNA, Surgeon and Anesthesiologist  Anesthesia Plan Comments:         Anesthesia Quick Evaluation

## 2018-10-21 NOTE — Interval H&P Note (Signed)
History and Physical Interval Note:  10/21/2018 7:12 AM  Gilbert White  has presented today for surgery, with the diagnosis of END STAGE RENAL DISEASE FOR HEMODIALYSIS ACCESS  The various methods of treatment have been discussed with the patient and family. After consideration of risks, benefits and other options for treatment, the patient has consented to  Procedure(s): ARTERIOVENOUS (AV) FISTULA CREATION VERSUS GRAFT LEFT ARM (Left) as a surgical intervention .  The patient's history has been reviewed, patient examined, no change in status, stable for surgery.  I have reviewed the patient's chart and labs.  Questions were answered to the patient's satisfaction.     [name]

## 2018-10-22 ENCOUNTER — Encounter (HOSPITAL_COMMUNITY): Payer: Self-pay | Admitting: Surgery

## 2018-10-22 ENCOUNTER — Telehealth: Payer: Self-pay | Admitting: Surgery

## 2018-10-22 NOTE — Progress Notes (Signed)
  10/22/18  Subjective:  AVF fistula was placed yesterday. Pt tolerated procedure well. Seen during HD today.   Objective:  Vital signs in last 24 hours:  Temperature 97 pulse 90 respirations 18 blood pressure 103/52  Physical Exam: General:  No acute distress  HEENT  moist oral mucous membranes  Neck  supple  Pulm/lungs  normal breathing effort, scattered rhonchi  CVS/Heart  irregular, no rubs  Abdomen:   Soft, mildly distended, BS present  Extremities:  No peripheral edema  Neurologic:  Alert, oriented, follows commands  Skin:  No acute rashes  Access:  Right IJ PermCath       Basic Metabolic Panel:  Recent Labs  Lab 10/18/18 0900 10/20/18 0517  NA 138 138  K 4.4 3.6  CL 101 105  CO2 22 18*  GLUCOSE 109* 109*  BUN 75* 71*  CREATININE 9.77* 8.89*  CALCIUM 8.4* 8.2*  PHOS 5.9* 4.7*     CBC: Recent Labs  Lab 10/18/18 0900 10/20/18 0517 10/21/18 0729  WBC 5.9 6.0 5.7  HGB 10.9* 10.4* 9.9*  HCT 33.6* 33.6* 31.2*  MCV 84.0 85.5 85.0  PLT 128* 144* 147*      Lab Results  Component Value Date   HEPBSAG Negative 10/04/2018      Microbiology:  No results found for this or any previous visit (from the past 240 hour(s)).  Coagulation Studies: Recent Labs    10/20/18 0517  LABPROT 13.5  INR 1.04    Urinalysis: No results for input(s): COLORURINE, LABSPEC, PHURINE, GLUCOSEU, HGBUR, BILIRUBINUR, KETONESUR, PROTEINUR, UROBILINOGEN, NITRITE, LEUKOCYTESUR in the last 72 hours.  Invalid input(s): APPERANCEUR    Imaging: No results found.   Medications:       Assessment/ Plan:  72 y.o. male morbid obesity, tobacco abuse, obstructive sleep apnea, diabetes mellitus type 2, hypertension, hyperlipidemia, COPD, prostate cancer, peripheral vascular disease, hypertension, low-grade papillary cancer of the bladder, chronic kidney disease stage IV, who was admitted to [facility] on 10/02/2018 for ongoing management.   1.  Acute renal  failure/chronic kidney disease stage IV baseline EGFR 19.    AV fistula has been placed.  Appreciate vascular surgery assistance.  Patient seen and evaluated during dialysis and tolerating well.  Next dialysis on Monday.  2.  Secondary hyperparathyroidism.  Phosphorus was 4.7 at last check.  Repeat today.  3.  Anemia chronic kidney disease.  Most recent hemoglobin was actually down to 9.9.  Consider Procrit if hemoglobin continues to drop.   LOS: 0 Amilio Zehnder 11/8/20198:18 AM  [place], [place]  Note: This note was prepared with Dragon dictation. Any transcription errors are unintentional

## 2018-10-22 NOTE — Telephone Encounter (Signed)
sch appt spk to pt wife mld ltr 11/29/18 1pm Dialysis Duplex 2pm p/o PA

## 2018-10-22 NOTE — Telephone Encounter (Signed)
-----   Message from Mena Goes, RN sent at 10/21/2018 11:07 AM EST ----- Regarding: 6 weeks with duplex and PA   ----- Message ----- From: Iline Oven Sent: 10/21/2018  10:32 AM EST To: [facility] Admin [facility], [facility] Charge [facility]   Can you schedule an appt for this pt in 6 weeks with fistula duplex on PA clinic.  PO L arm 1st stage basilic fistula. Thanks, MAtt

## 2018-10-24 LAB — RENAL FUNCTION PANEL
Albumin: 2.7 g/dL — ABNORMAL LOW (ref 3.5–5.0)
Anion gap: 12 (ref 5–15)
BUN: 50 mg/dL — ABNORMAL HIGH (ref 8–23)
CALCIUM: 8.4 mg/dL — AB (ref 8.9–10.3)
CHLORIDE: 102 mmol/L (ref 98–111)
CO2: 22 mmol/L (ref 22–32)
Creatinine, Ser: 6.27 mg/dL — ABNORMAL HIGH (ref 0.61–1.24)
GFR calc non Af Amer: 8 mL/min — ABNORMAL LOW (ref >60)
GFR, EST AFRICAN AMERICAN: 9 mL/min — AB (ref >60)
Glucose, Bld: 170 mg/dL — ABNORMAL HIGH (ref 70–99)
Phosphorus: 2.8 mg/dL (ref 2.5–4.6)
Potassium: 4 mmol/L (ref 3.5–5.1)
SODIUM: 136 mmol/L (ref 135–145)

## 2018-10-24 LAB — CBC
HEMATOCRIT: 33.4 % — AB (ref 39.0–52.0)
HEMOGLOBIN: 10.6 g/dL — AB (ref 13.0–17.0)
MCH: 27.6 pg (ref 26.0–34.0)
MCHC: 31.7 g/dL (ref 30.0–36.0)
MCV: 87 fL (ref 80.0–100.0)
Platelets: 180 10*3/uL (ref 150–400)
RBC: 3.84 MIL/uL — AB (ref 4.22–5.81)
RDW: 17.4 % — ABNORMAL HIGH (ref 11.5–15.5)
WBC: 6.8 10*3/uL (ref 4.0–10.5)
nRBC: 0 % (ref 0.0–0.2)

## 2018-10-25 LAB — RENAL FUNCTION PANEL
ALBUMIN: 2.7 g/dL — AB (ref 3.5–5.0)
Anion gap: 13 (ref 5–15)
BUN: 70 mg/dL — ABNORMAL HIGH (ref 8–23)
CO2: 22 mmol/L (ref 22–32)
Calcium: 8.5 mg/dL — ABNORMAL LOW (ref 8.9–10.3)
Chloride: 102 mmol/L (ref 98–111)
Creatinine, Ser: 7.19 mg/dL — ABNORMAL HIGH (ref 0.61–1.24)
GFR calc Af Amer: 8 mL/min — ABNORMAL LOW (ref >60)
GFR, EST NON AFRICAN AMERICAN: 7 mL/min — AB (ref >60)
GLUCOSE: 230 mg/dL — AB (ref 70–99)
Phosphorus: 3.1 mg/dL (ref 2.5–4.6)
Potassium: 3.9 mmol/L (ref 3.5–5.1)
SODIUM: 137 mmol/L (ref 135–145)

## 2018-10-25 LAB — CBC
HCT: 32.2 % — ABNORMAL LOW (ref 39.0–52.0)
HEMOGLOBIN: 9.9 g/dL — AB (ref 13.0–17.0)
MCH: 27 pg (ref 26.0–34.0)
MCHC: 30.7 g/dL (ref 30.0–36.0)
MCV: 87.7 fL (ref 80.0–100.0)
Platelets: 198 10*3/uL (ref 150–400)
RBC: 3.67 MIL/uL — ABNORMAL LOW (ref 4.22–5.81)
RDW: 17.2 % — AB (ref 11.5–15.5)
WBC: 6.1 10*3/uL (ref 4.0–10.5)
nRBC: 0 % (ref 0.0–0.2)

## 2018-10-25 NOTE — Progress Notes (Signed)
  10/25/18  Subjective:  Seen and evaluated during hemodialysis.  Tolerating well. Blood flow rate 350.   Objective:  Vital signs in last 24 hours:  Temperature 98 pulse 77 respirations 18 blood pressure 144/63  Physical Exam: General:  No acute distress  HEENT  moist oral mucous membranes  Neck  supple  Pulm/lungs  normal breathing effort, scattered rhonchi  CVS/Heart  irregular, no rubs  Abdomen:   Soft, mildly distended, BS present  Extremities:  No peripheral edema  Neurologic:  Alert, oriented, follows commands  Skin:  No acute rashes  Access:  Right IJ PermCath       Basic Metabolic Panel:  Recent Labs  Lab 10/20/18 0517 10/24/18 0632 10/25/18 0720  NA 138 136 137  K 3.6 4.0 3.9  CL 105 102 102  CO2 18* 22 22  GLUCOSE 109* 170* 230*  BUN 71* 50* 70*  CREATININE 8.89* 6.27* 7.19*  CALCIUM 8.2* 8.4* 8.5*  PHOS 4.7* 2.8 3.1     CBC: Recent Labs  Lab 10/20/18 0517 10/21/18 0729 10/24/18 0632 10/25/18 0720  WBC 6.0 5.7 6.8 6.1  HGB 10.4* 9.9* 10.6* 9.9*  HCT 33.6* 31.2* 33.4* 32.2*  MCV 85.5 85.0 87.0 87.7  PLT 144* 147* 180 198      Lab Results  Component Value Date   HEPBSAG Negative 10/04/2018      Microbiology:  No results found for this or any previous visit (from the past 240 hour(s)).  Coagulation Studies: No results for input(s): LABPROT, INR in the last 72 hours.  Urinalysis: No results for input(s): COLORURINE, LABSPEC, PHURINE, GLUCOSEU, HGBUR, BILIRUBINUR, KETONESUR, PROTEINUR, UROBILINOGEN, NITRITE, LEUKOCYTESUR in the last 72 hours.  Invalid input(s): APPERANCEUR    Imaging: No results found.   Medications:       Assessment/ Plan:  72 y.o. male morbid obesity, tobacco abuse, obstructive sleep apnea, diabetes mellitus type 2, hypertension, hyperlipidemia, COPD, prostate cancer, peripheral vascular disease, hypertension, low-grade papillary cancer of the bladder, chronic kidney disease stage IV, who was admitted to  [facility] on 10/02/2018 for ongoing management.   1.  Acute renal failure/chronic kidney disease stage IV baseline EGFR 19.    Patient seen and evaluated during hemodialysis and tolerating well.  We plan to complete dialysis today and next dialysis treatment will be on Wednesday.  Outpatient hemodialysis planning is ongoing.  2.  Secondary hyperparathyroidism.  Phosphorus 3.1 and at target.  3.  Anemia chronic kidney disease.  IMA globin currently 9.9.  He will likely need Epogen as an outpatient.   LOS: 0 Alpheus Stiff 11/11/20199:32 AM  [place], [place]  Note: This note was prepared with Dragon dictation. Any transcription errors are unintentional

## 2018-10-27 LAB — RENAL FUNCTION PANEL
ALBUMIN: 2.3 g/dL — AB (ref 3.5–5.0)
Anion gap: 8 (ref 5–15)
BUN: 60 mg/dL — AB (ref 8–23)
CALCIUM: 8.3 mg/dL — AB (ref 8.9–10.3)
CO2: 23 mmol/L (ref 22–32)
Chloride: 107 mmol/L (ref 98–111)
Creatinine, Ser: 6.26 mg/dL — ABNORMAL HIGH (ref 0.61–1.24)
GFR calc Af Amer: 9 mL/min — ABNORMAL LOW (ref >60)
GFR calc non Af Amer: 8 mL/min — ABNORMAL LOW (ref >60)
Glucose, Bld: 168 mg/dL — ABNORMAL HIGH (ref 70–99)
Phosphorus: 2.5 mg/dL (ref 2.5–4.6)
Potassium: 4.2 mmol/L (ref 3.5–5.1)
SODIUM: 138 mmol/L (ref 135–145)

## 2018-10-27 LAB — CBC
HCT: 28.3 % — ABNORMAL LOW (ref 39.0–52.0)
Hemoglobin: 8.6 g/dL — ABNORMAL LOW (ref 13.0–17.0)
MCH: 26.9 pg (ref 26.0–34.0)
MCHC: 30.4 g/dL (ref 30.0–36.0)
MCV: 88.4 fL (ref 80.0–100.0)
NRBC: 0.4 % — AB (ref 0.0–0.2)
Platelets: 167 10*3/uL (ref 150–400)
RBC: 3.2 MIL/uL — ABNORMAL LOW (ref 4.22–5.81)
RDW: 17.8 % — AB (ref 11.5–15.5)
WBC: 5.4 10*3/uL (ref 4.0–10.5)

## 2018-10-27 NOTE — Progress Notes (Signed)
  10/27/18  Subjective:  Patient just completed hemodialysis.  Tolerated well. Currently sitting up in chair.   Objective:  Vital signs in last 24 hours:  Temperature 98 pulse 90 respiration 17 blood pressure 141/70  Physical Exam: General:  No acute distress  HEENT  moist oral mucous membranes  Neck  supple  Pulm/lungs  normal breathing effort, scattered rhonchi  CVS/Heart  irregular, no rubs  Abdomen:   Soft, mildly distended, BS present  Extremities:  No peripheral edema  Neurologic:  Alert, oriented, follows commands  Skin:  No acute rashes  Access:  Right IJ PermCath       Basic Metabolic Panel:  Recent Labs  Lab 10/24/18 0632 10/25/18 0720 10/27/18 0525  NA 136 137 138  K 4.0 3.9 4.2  CL 102 102 107  CO2 22 22 23   GLUCOSE 170* 230* 168*  BUN 50* 70* 60*  CREATININE 6.27* 7.19* 6.26*  CALCIUM 8.4* 8.5* 8.3*  PHOS 2.8 3.1 2.5     CBC: Recent Labs  Lab 10/21/18 0729 10/24/18 0632 10/25/18 0720 10/27/18 0525  WBC 5.7 6.8 6.1 5.4  HGB 9.9* 10.6* 9.9* 8.6*  HCT 31.2* 33.4* 32.2* 28.3*  MCV 85.0 87.0 87.7 88.4  PLT 147* 180 198 167      Lab Results  Component Value Date   HEPBSAG Negative 10/04/2018      Microbiology:  No results found for this or any previous visit (from the past 240 hour(s)).  Coagulation Studies: No results for input(s): LABPROT, INR in the last 72 hours.  Urinalysis: No results for input(s): COLORURINE, LABSPEC, PHURINE, GLUCOSEU, HGBUR, BILIRUBINUR, KETONESUR, PROTEINUR, UROBILINOGEN, NITRITE, LEUKOCYTESUR in the last 72 hours.  Invalid input(s): APPERANCEUR    Imaging: No results found.   Medications:       Assessment/ Plan:  72 y.o. male morbid obesity, tobacco abuse, obstructive sleep apnea, diabetes mellitus type 2, hypertension, hyperlipidemia, COPD, prostate cancer, peripheral vascular disease, hypertension, low-grade papillary cancer of the bladder, chronic kidney disease stage IV, who was admitted  to [facility] on 10/02/2018 for ongoing management.   1.    End-stage renal disease.   Patient now declared end-stage renal disease.  No significant renal recovery.  Left upper extremity AV fistula placed.  Next dialysis on Friday.  Outpatient dialysis placement pending.  2.  Secondary hyperparathyroidism.  Phosphorus remains at target of 2.5.  Continue to monitor.  3.  Anemia chronic kidney disease.   over the past several days hemoglobin has dropped.  Hemoglobin currently 8.6.  He will likely need Epogen as an outpatient.   LOS: 0 Franciscojavier Wronski 11/13/201911:44 AM  [place], [place]  Note: This note was prepared with Dragon dictation. Any transcription errors are unintentional

## 2018-10-29 LAB — RENAL FUNCTION PANEL
ANION GAP: 12 (ref 5–15)
Albumin: 2.6 g/dL — ABNORMAL LOW (ref 3.5–5.0)
BUN: 59 mg/dL — ABNORMAL HIGH (ref 8–23)
CALCIUM: 8.4 mg/dL — AB (ref 8.9–10.3)
CHLORIDE: 104 mmol/L (ref 98–111)
CO2: 24 mmol/L (ref 22–32)
Creatinine, Ser: 5.37 mg/dL — ABNORMAL HIGH (ref 0.61–1.24)
GFR calc non Af Amer: 10 mL/min — ABNORMAL LOW (ref >60)
GFR, EST AFRICAN AMERICAN: 11 mL/min — AB (ref >60)
Glucose, Bld: 165 mg/dL — ABNORMAL HIGH (ref 70–99)
POTASSIUM: 3.8 mmol/L (ref 3.5–5.1)
Phosphorus: 2.6 mg/dL (ref 2.5–4.6)
Sodium: 140 mmol/L (ref 135–145)

## 2018-10-29 LAB — CBC
HEMATOCRIT: 29.6 % — AB (ref 39.0–52.0)
HEMOGLOBIN: 9.3 g/dL — AB (ref 13.0–17.0)
MCH: 27.5 pg (ref 26.0–34.0)
MCHC: 31.4 g/dL (ref 30.0–36.0)
MCV: 87.6 fL (ref 80.0–100.0)
Platelets: 173 10*3/uL (ref 150–400)
RBC: 3.38 MIL/uL — AB (ref 4.22–5.81)
RDW: 18.4 % — ABNORMAL HIGH (ref 11.5–15.5)
WBC: 6.4 10*3/uL (ref 4.0–10.5)
nRBC: 0 % (ref 0.0–0.2)

## 2018-10-29 NOTE — Progress Notes (Signed)
  10/29/18  Subjective:  Patient completed hemodialysis today.  Tolerated well. In good spirits.    Objective:  Vital signs in last 24 hours:  Temperature 97.5 pulse 82 respirations 21 blood pressure 143/72  Physical Exam: General:  No acute distress  HEENT  moist oral mucous membranes  Neck  supple  Pulm/lungs  normal breathing effort, scattered rhonchi  CVS/Heart  irregular, no rubs  Abdomen:   Soft, NT, BS present  Extremities:  No peripheral edema  Neurologic:  Alert, oriented, follows commands  Skin:  No acute rashes  Access:  Right IJ PermCath       Basic Metabolic Panel:  Recent Labs  Lab 10/24/18 0632 10/25/18 0720 10/27/18 0525 10/29/18 0710  NA 136 137 138 140  K 4.0 3.9 4.2 3.8  CL 102 102 107 104  CO2 22 22 23 24   GLUCOSE 170* 230* 168* 165*  BUN 50* 70* 60* 59*  CREATININE 6.27* 7.19* 6.26* 5.37*  CALCIUM 8.4* 8.5* 8.3* 8.4*  PHOS 2.8 3.1 2.5 2.6     CBC: Recent Labs  Lab 10/24/18 0632 10/25/18 0720 10/27/18 0525 10/29/18 0710  WBC 6.8 6.1 5.4 6.4  HGB 10.6* 9.9* 8.6* 9.3*  HCT 33.4* 32.2* 28.3* 29.6*  MCV 87.0 87.7 88.4 87.6  PLT 180 198 167 173      Lab Results  Component Value Date   HEPBSAG Negative 10/04/2018      Microbiology:  No results found for this or any previous visit (from the past 240 hour(s)).  Coagulation Studies: No results for input(s): LABPROT, INR in the last 72 hours.  Urinalysis: No results for input(s): COLORURINE, LABSPEC, PHURINE, GLUCOSEU, HGBUR, BILIRUBINUR, KETONESUR, PROTEINUR, UROBILINOGEN, NITRITE, LEUKOCYTESUR in the last 72 hours.  Invalid input(s): APPERANCEUR    Imaging: No results found.   Medications:       Assessment/ Plan:  72 y.o. male morbid obesity, tobacco abuse, obstructive sleep apnea, diabetes mellitus type 2, hypertension, hyperlipidemia, COPD, prostate cancer, peripheral vascular disease, hypertension, low-grade papillary cancer of the bladder, chronic kidney disease  stage IV, who was admitted to [facility] on 10/02/2018 for ongoing management.   1.    End-stage renal disease.   Patient completed hemodialysis today.  Next dialysis on Monday if still here.  2.  Secondary hyperparathyroidism.  Repeat serum phosphorus on Monday.  3.  Anemia chronic kidney disease.   Hemoglobin up to 9.3 today.  Consider Epogen as an outpatient.   LOS: 0 Sekai Nayak 11/15/20193:29 PM  [place], [place]  Note: This note was prepared with Dragon dictation. Any transcription errors are unintentional

## 2018-11-01 ENCOUNTER — Other Ambulatory Visit: Payer: Self-pay

## 2018-11-01 DIAGNOSIS — N186 End stage renal disease: Secondary | ICD-10-CM

## 2018-11-01 DIAGNOSIS — Z992 Dependence on renal dialysis: Principal | ICD-10-CM

## 2018-11-01 LAB — RENAL FUNCTION PANEL
ALBUMIN: 2.5 g/dL — AB (ref 3.5–5.0)
Anion gap: 9 (ref 5–15)
BUN: 68 mg/dL — ABNORMAL HIGH (ref 8–23)
CALCIUM: 8.3 mg/dL — AB (ref 8.9–10.3)
CO2: 23 mmol/L (ref 22–32)
CREATININE: 5.44 mg/dL — AB (ref 0.61–1.24)
Chloride: 105 mmol/L (ref 98–111)
GFR calc Af Amer: 11 mL/min — ABNORMAL LOW (ref >60)
GFR calc non Af Amer: 9 mL/min — ABNORMAL LOW (ref >60)
GLUCOSE: 130 mg/dL — AB (ref 70–99)
PHOSPHORUS: 3.6 mg/dL (ref 2.5–4.6)
Potassium: 3.8 mmol/L (ref 3.5–5.1)
SODIUM: 137 mmol/L (ref 135–145)

## 2018-11-01 LAB — CBC
HCT: 26.4 % — ABNORMAL LOW (ref 39.0–52.0)
Hemoglobin: 8.4 g/dL — ABNORMAL LOW (ref 13.0–17.0)
MCH: 28.1 pg (ref 26.0–34.0)
MCHC: 31.8 g/dL (ref 30.0–36.0)
MCV: 88.3 fL (ref 80.0–100.0)
NRBC: 0 % (ref 0.0–0.2)
PLATELETS: 137 10*3/uL — AB (ref 150–400)
RBC: 2.99 MIL/uL — AB (ref 4.22–5.81)
RDW: 18.6 % — AB (ref 11.5–15.5)
WBC: 5.7 10*3/uL (ref 4.0–10.5)

## 2018-11-01 LAB — HEPATIC FUNCTION PANEL
ALT: 27 U/L (ref 0–44)
AST: 27 U/L (ref 15–41)
Albumin: 2.6 g/dL — ABNORMAL LOW (ref 3.5–5.0)
Alkaline Phosphatase: 110 U/L (ref 38–126)
BILIRUBIN DIRECT: 0.1 mg/dL (ref 0.0–0.2)
Indirect Bilirubin: 0.5 mg/dL (ref 0.3–0.9)
Total Bilirubin: 0.6 mg/dL (ref 0.3–1.2)
Total Protein: 5.1 g/dL — ABNORMAL LOW (ref 6.5–8.1)

## 2018-11-01 NOTE — Progress Notes (Signed)
  11/01/18  Subjective:  Patient seen and evaluated during hemodialysis.  Tolerating well at the moment.  In good spirits today.  Objective:  Vital signs in last 24 hours:  Temperature 97.7 pulse 76 respiration 16 blood pressure 148/74  Physical Exam: General:  No acute distress  HEENT  moist oral mucous membranes  Neck  supple  Pulm/lungs  normal breathing effort, scattered rhonchi  CVS/Heart  irregular, no rubs  Abdomen:   Soft, NT, BS present  Extremities:  No peripheral edema  Neurologic:  Alert, oriented, follows commands  Skin:  No acute rashes  Access:  Right IJ PermCath       Basic Metabolic Panel:  Recent Labs  Lab 10/27/18 0525 10/29/18 0710 11/01/18 0654  NA 138 140 137  K 4.2 3.8 3.8  CL 107 104 105  CO2 23 24 23   GLUCOSE 168* 165* 130*  BUN 60* 59* 68*  CREATININE 6.26* 5.37* 5.44*  CALCIUM 8.3* 8.4* 8.3*  PHOS 2.5 2.6 3.6     CBC: Recent Labs  Lab 10/27/18 0525 10/29/18 0710 11/01/18 0654  WBC 5.4 6.4 5.7  HGB 8.6* 9.3* 8.4*  HCT 28.3* 29.6* 26.4*  MCV 88.4 87.6 88.3  PLT 167 173 137*      Lab Results  Component Value Date   HEPBSAG Negative 10/04/2018      Microbiology:  No results found for this or any previous visit (from the past 240 hour(s)).  Coagulation Studies: No results for input(s): LABPROT, INR in the last 72 hours.  Urinalysis: No results for input(s): COLORURINE, LABSPEC, PHURINE, GLUCOSEU, HGBUR, BILIRUBINUR, KETONESUR, PROTEINUR, UROBILINOGEN, NITRITE, LEUKOCYTESUR in the last 72 hours.  Invalid input(s): APPERANCEUR    Imaging: No results found.   Medications:       Assessment/ Plan:  72 y.o. male morbid obesity, tobacco abuse, obstructive sleep apnea, diabetes mellitus type 2, hypertension, hyperlipidemia, COPD, prostate cancer, peripheral vascular disease, hypertension, low-grade papillary cancer of the bladder, chronic kidney disease stage IV, who was admitted to [facility] on 10/02/2018 for  ongoing management.   1.    End-stage renal disease.   Seen and evaluated during hemodialysis treatment.  Tolerating well.  Complete dialysis treatment today.  Next treatment on Wednesday if still here.  2.  Secondary hyperparathyroidism.  Phosphorus currently 3.6 and at target.  Continue to periodically monitor.  3.  Anemia chronic kidney disease.   Recommend starting the patient on erythropoietin stimulant agents as an outpatient as well.   LOS: 0 Inri Sobieski 11/18/20197:58 AM  [place], [place]  Note: This note was prepared with Dragon dictation. Any transcription errors are unintentional

## 2018-11-26 NOTE — Progress Notes (Signed)
  POST OPERATIVE OFFICE NOTE    CC:  F/u for surgery  HPI:  This is a 72 y.o. male who is s/p left 1st stage basilic vein transposition on 10/21/18 by Dr. Trula Slade.  He had a tunneled dialysis catheter placed on 10/18/18 by interventional radiology.   He states he has done well.  He denies any pain or numbness in his left hand.  He is currently on dialysis on M/W/F at the [facility] dialysis center location.   Current Outpatient Medications  Medication Sig Dispense Refill  . HYDROcodone-acetaminophen (NORCO) 5-325 MG tablet Take 1 tablet by mouth every 6 (six) hours as needed for moderate pain. 10 tablet 0   No current facility-administered medications for this visit.      ROS:  See HPI  Physical Exam:  Today's Vitals   11/29/18 1347  BP: (!) 115/56  Pulse: 72  Resp: 20  Temp: (!) 97.2 F (36.2 C)  SpO2: 96%  Weight: 216 lb (98 kg)  Height: 5' 9.5" (1.765 m)   Body mass index is 31.44 kg/m.  Incision:  Healed nicely Extremities:  There is an excellent thrill/bruit within the fistula.  Left hand with sensation and motor in tact; strong left hand grip.  Unable to palpate left radial pulse.    Dialysis duplex 11/29/18: OUTFLOW VEINPSV (cm/s)Diameter (cm)Depth (cm)Describe  +------------+----------+-------------+----------+--------+  Prox UA     161    0.77     2.15        +------------+----------+-------------+----------+--------+  Mid UA     127    0.70     1.67        +------------+----------+-------------+----------+--------+  Dist UA     207    0.66     1.34        +------------+----------+-------------+----------+--------+  AC Fossa    161    0.51     1.36          Assessment/Plan:  This is a 72 y.o. male who is s/p: left 1st stage basilic vein transposition on 10/21/18 by Dr. Trula Slade.  -pt does not have any evidence of steal sx.  His basilic vein has matured nicely.  We  will schedule him for 2nd stage left basilic vein transposition with Dr. Trula Slade on a non dialysis day.  He states he takes a daily aspirin but is not on any other blood thinners.  -discussed 2nd stage transposition and possible complications not limited to nerve damage, bleeding, infection with pt & his wife and he expressed understanding.  -wanted MD note for not wearing seatbelt due to TDC-discussed with him about a soft seatbelt strap to wear over the strap.     Leontine Locket, PA-C [facility] [phone]  Clinic MD:  none

## 2018-11-26 NOTE — H&P (View-Only) (Signed)
  POST OPERATIVE OFFICE NOTE    CC:  F/u for surgery  HPI:  This is a 72 y.o. male who is s/p left 1st stage basilic vein transposition on 10/21/18 by Dr. Trula Slade.  He had a tunneled dialysis catheter placed on 10/18/18 by interventional radiology.   He states he has done well.  He denies any pain or numbness in his left hand.  He is currently on dialysis on M/W/F at the [facility] dialysis center location.   Current Outpatient Medications  Medication Sig Dispense Refill  . HYDROcodone-acetaminophen (NORCO) 5-325 MG tablet Take 1 tablet by mouth every 6 (six) hours as needed for moderate pain. 10 tablet 0   No current facility-administered medications for this visit.      ROS:  See HPI  Physical Exam:  Today's Vitals   11/29/18 1347  BP: (!) 115/56  Pulse: 72  Resp: 20  Temp: (!) 97.2 F (36.2 C)  SpO2: 96%  Weight: 216 lb (98 kg)  Height: 5' 9.5" (1.765 m)   Body mass index is 31.44 kg/m.  Incision:  Healed nicely Extremities:  There is an excellent thrill/bruit within the fistula.  Left hand with sensation and motor in tact; strong left hand grip.  Unable to palpate left radial pulse.    Dialysis duplex 11/29/18: OUTFLOW VEINPSV (cm/s)Diameter (cm)Depth (cm)Describe  +------------+----------+-------------+----------+--------+  Prox UA     161    0.77     2.15        +------------+----------+-------------+----------+--------+  Mid UA     127    0.70     1.67        +------------+----------+-------------+----------+--------+  Dist UA     207    0.66     1.34        +------------+----------+-------------+----------+--------+  AC Fossa    161    0.51     1.36          Assessment/Plan:  This is a 72 y.o. male who is s/p: left 1st stage basilic vein transposition on 10/21/18 by Dr. Trula Slade.  -pt does not have any evidence of steal sx.  His basilic vein has matured nicely.  We  will schedule him for 2nd stage left basilic vein transposition with Dr. Trula Slade on a non dialysis day.  He states he takes a daily aspirin but is not on any other blood thinners.  -discussed 2nd stage transposition and possible complications not limited to nerve damage, bleeding, infection with pt & his wife and he expressed understanding.  -wanted MD note for not wearing seatbelt due to TDC-discussed with him about a soft seatbelt strap to wear over the strap.     Leontine Locket, PA-C [facility] [phone]  Clinic MD:  none

## 2018-11-29 ENCOUNTER — Ambulatory Visit (HOSPITAL_COMMUNITY)
Admission: RE | Admit: 2018-11-29 | Discharge: 2018-11-29 | Disposition: A | Discharge status: Home / Self Care | Payer: Medicare Other | Source: Ambulatory Visit | Attending: Surgery | Admitting: Surgery

## 2018-11-29 ENCOUNTER — Ambulatory Visit (INDEPENDENT_AMBULATORY_CARE_PROVIDER_SITE_OTHER): Payer: Medicare Other | Admitting: Physician Assistant

## 2018-11-29 ENCOUNTER — Other Ambulatory Visit: Payer: Self-pay | Admitting: *Deleted

## 2018-11-29 ENCOUNTER — Encounter (HOSPITAL_COMMUNITY): Payer: Medicare Other

## 2018-11-29 ENCOUNTER — Other Ambulatory Visit: Payer: Self-pay

## 2018-11-29 ENCOUNTER — Encounter: Payer: Self-pay | Admitting: *Deleted

## 2018-11-29 VITALS — BP 115/56 | HR 72 | Temp 97.2°F | Resp 20 | Ht 69.5 in | Wt 216.0 lb

## 2018-11-29 DIAGNOSIS — N186 End stage renal disease: Secondary | ICD-10-CM | POA: Insufficient documentation

## 2018-11-29 DIAGNOSIS — Z992 Dependence on renal dialysis: Secondary | ICD-10-CM | POA: Insufficient documentation

## 2018-12-21 ENCOUNTER — Other Ambulatory Visit: Payer: Self-pay

## 2018-12-21 ENCOUNTER — Encounter (HOSPITAL_COMMUNITY): Payer: Self-pay | Admitting: *Deleted

## 2018-12-21 NOTE — Progress Notes (Signed)
Spoke with pt's wife, Narda Rutherford after pt asked me to do so. She states pt was recently diagnosed with Atrial fib, but seems to be doing better. No other heart problems she states. Pt is a type 2 diabetic. Last A1C was 7.9 on 10/03/18. She states his fasting blood sugar is usually around 120. Instructed her to have pt take 70% of his regular dose of Novolin 70/30 insulin Wednesday evening, he will take 24 units. Instructed her to have pt check his blood sugar when he gets up Thursday AM and every 2 hours until he leaves for the hospital. Pt is not to take Novolin 70/30 morning of surgery. If blood sugar is 70 or below, treat with 1/2 cup of clear juice (apple or cranberry) and recheck blood sugar 15 minutes after drinking juice. If blood sugar continues to be 70 or below, call the Short Stay department and ask to speak to a nurse. Janie voiced understanding.

## 2018-12-23 ENCOUNTER — Encounter (HOSPITAL_COMMUNITY): Payer: Self-pay | Admitting: *Deleted

## 2018-12-23 ENCOUNTER — Telehealth: Payer: Self-pay | Admitting: *Deleted

## 2018-12-23 ENCOUNTER — Telehealth: Payer: Self-pay | Admitting: Surgery

## 2018-12-23 ENCOUNTER — Ambulatory Visit (HOSPITAL_COMMUNITY)
Admission: RE | Admit: 2018-12-23 | Discharge: 2018-12-23 | Disposition: A | Discharge status: Home / Self Care | Payer: Medicare Other | Attending: Surgery | Admitting: Surgery

## 2018-12-23 ENCOUNTER — Ambulatory Visit (HOSPITAL_COMMUNITY): Payer: Medicare Other | Admitting: Physician Assistant

## 2018-12-23 ENCOUNTER — Encounter (HOSPITAL_COMMUNITY)
Admission: RE | Disposition: A | Discharge status: Home / Self Care | Payer: Self-pay | Source: Home / Self Care | Attending: Surgery

## 2018-12-23 DIAGNOSIS — Z992 Dependence on renal dialysis: Secondary | ICD-10-CM | POA: Diagnosis not present

## 2018-12-23 DIAGNOSIS — N185 Chronic kidney disease, stage 5: Secondary | ICD-10-CM

## 2018-12-23 DIAGNOSIS — N186 End stage renal disease: Secondary | ICD-10-CM | POA: Insufficient documentation

## 2018-12-23 DIAGNOSIS — I120 Hypertensive chronic kidney disease with stage 5 chronic kidney disease or end stage renal disease: Secondary | ICD-10-CM | POA: Diagnosis present

## 2018-12-23 HISTORY — DX: Type 2 diabetes mellitus without complications: E11.9

## 2018-12-23 HISTORY — DX: Chronic obstructive pulmonary disease, unspecified: J44.9

## 2018-12-23 HISTORY — DX: Malignant (primary) neoplasm, unspecified: C80.1

## 2018-12-23 HISTORY — DX: Unspecified atrial fibrillation: I48.91

## 2018-12-23 HISTORY — DX: Essential (primary) hypertension: I10

## 2018-12-23 HISTORY — DX: Personal history of other diseases of the digestive system: Z87.19

## 2018-12-23 HISTORY — DX: Chronic kidney disease, unspecified: N18.9

## 2018-12-23 HISTORY — DX: Sleep apnea, unspecified: G47.30

## 2018-12-23 HISTORY — PX: BASCILIC VEIN TRANSPOSITION: SHX5742

## 2018-12-23 HISTORY — DX: Gastro-esophageal reflux disease without esophagitis: K21.9

## 2018-12-23 HISTORY — DX: Personal history of peptic ulcer disease: Z87.11

## 2018-12-23 LAB — POCT I-STAT 4, (NA,K, GLUC, HGB,HCT)
Glucose, Bld: 117 mg/dL — ABNORMAL HIGH (ref 70–99)
HCT: 36 % — ABNORMAL LOW (ref 39.0–52.0)
Hemoglobin: 12.2 g/dL — ABNORMAL LOW (ref 13.0–17.0)
POTASSIUM: 3.9 mmol/L (ref 3.5–5.1)
SODIUM: 135 mmol/L (ref 135–145)

## 2018-12-23 LAB — GLUCOSE, CAPILLARY: Glucose-Capillary: 138 mg/dL — ABNORMAL HIGH (ref 70–99)

## 2018-12-23 SURGERY — TRANSPOSITION, VEIN, BASILIC
Anesthesia: General | Site: Arm Upper | Laterality: Left

## 2018-12-23 MED ORDER — FENTANYL CITRATE (PF) 100 MCG/2ML IJ SOLN
INTRAMUSCULAR | Status: DC | PRN
  Administered 2018-12-23 (×3): 25 ug via INTRAVENOUS
  Administered 2018-12-23: 100 ug via INTRAVENOUS

## 2018-12-23 MED ORDER — CEFAZOLIN SODIUM-DEXTROSE 2-3 GM-%(50ML) IV SOLR
INTRAVENOUS | Status: DC | PRN
  Administered 2018-12-23: 2 g via INTRAVENOUS

## 2018-12-23 MED ORDER — LIDOCAINE-EPINEPHRINE (PF) 1 %-1:200000 IJ SOLN
INTRAMUSCULAR | Status: AC
  Filled 2018-12-23: qty 30

## 2018-12-23 MED ORDER — DEXAMETHASONE SODIUM PHOSPHATE 10 MG/ML IJ SOLN
INTRAMUSCULAR | Status: AC
  Filled 2018-12-23: qty 1

## 2018-12-23 MED ORDER — ONDANSETRON HCL 4 MG/2ML IJ SOLN
INTRAMUSCULAR | Status: AC
  Filled 2018-12-23: qty 2

## 2018-12-23 MED ORDER — HYDROCODONE-ACETAMINOPHEN 5-325 MG PO TABS: 1.0000 | ORAL_TABLET | Freq: Four times a day (QID) | ORAL | 0 refills | Status: DC | PRN

## 2018-12-23 MED ORDER — ONDANSETRON HCL 4 MG/2ML IJ SOLN
INTRAMUSCULAR | Status: DC | PRN
  Administered 2018-12-23: 4 mg via INTRAVENOUS

## 2018-12-23 MED ORDER — FENTANYL CITRATE (PF) 100 MCG/2ML IJ SOLN
INTRAMUSCULAR | Status: AC
  Filled 2018-12-23: qty 2

## 2018-12-23 MED ORDER — LIDOCAINE 2% (20 MG/ML) 5 ML SYRINGE
INTRAMUSCULAR | Status: AC
  Filled 2018-12-23: qty 10

## 2018-12-23 MED ORDER — SODIUM CHLORIDE 0.9 % IV SOLN
INTRAVENOUS | Status: DC | PRN
  Administered 2018-12-23: 500 mL

## 2018-12-23 MED ORDER — SODIUM CHLORIDE 0.9 % IV SOLN
INTRAVENOUS | Status: DC | PRN
  Administered 2018-12-23: 15 ug/min via INTRAVENOUS

## 2018-12-23 MED ORDER — ONDANSETRON HCL 4 MG/2ML IJ SOLN: 4.0000 mg | Freq: Once | INTRAMUSCULAR | Status: DC | PRN

## 2018-12-23 MED ORDER — PROPOFOL 10 MG/ML IV BOLUS
INTRAVENOUS | Status: DC | PRN
  Administered 2018-12-23: 150 mg via INTRAVENOUS

## 2018-12-23 MED ORDER — PROPOFOL 10 MG/ML IV BOLUS
INTRAVENOUS | Status: AC
  Filled 2018-12-23: qty 20

## 2018-12-23 MED ORDER — LIDOCAINE 2% (20 MG/ML) 5 ML SYRINGE
INTRAMUSCULAR | Status: DC | PRN
  Administered 2018-12-23: 40 mg via INTRAVENOUS

## 2018-12-23 MED ORDER — FENTANYL CITRATE (PF) 100 MCG/2ML IJ SOLN
25.0000 ug | INTRAMUSCULAR | Status: DC | PRN
  Administered 2018-12-23 (×2): 25 ug via INTRAVENOUS

## 2018-12-23 MED ORDER — SODIUM CHLORIDE 0.9 % IV SOLN
INTRAVENOUS | Status: DC
  Administered 2018-12-23: 09:00:00 via INTRAVENOUS
  Administered 2018-12-23: 35 mL/h via INTRAVENOUS

## 2018-12-23 MED ORDER — CEFAZOLIN SODIUM-DEXTROSE 2-4 GM/100ML-% IV SOLN: 2.0000 g | INTRAVENOUS | Status: DC

## 2018-12-23 MED ORDER — SODIUM CHLORIDE 0.9 % IV SOLN
INTRAVENOUS | Status: AC
  Filled 2018-12-23: qty 1.2

## 2018-12-23 MED ORDER — FENTANYL CITRATE (PF) 250 MCG/5ML IJ SOLN
INTRAMUSCULAR | Status: AC
  Filled 2018-12-23: qty 5

## 2018-12-23 MED ORDER — 0.9 % SODIUM CHLORIDE (POUR BTL) OPTIME
TOPICAL | Status: DC | PRN
  Administered 2018-12-23: 1000 mL

## 2018-12-23 MED ORDER — CEFAZOLIN SODIUM 1 G IJ SOLR
INTRAMUSCULAR | Status: AC
  Filled 2018-12-23: qty 20

## 2018-12-23 MED ORDER — PHENYLEPHRINE HCL 10 MG/ML IJ SOLN
INTRAMUSCULAR | Status: DC | PRN
  Administered 2018-12-23 (×2): 80 ug via INTRAVENOUS

## 2018-12-23 SURGICAL SUPPLY — 40 items
ARMBAND PINK RESTRICT EXTREMIT (MISCELLANEOUS) ×3 IMPLANT
CANISTER SUCT 3000ML PPV (MISCELLANEOUS) ×3 IMPLANT
CLIP VESOCCLUDE MED 24/CT (CLIP) ×2 IMPLANT
CLIP VESOCCLUDE MED 6/CT (CLIP) ×2 IMPLANT
CLIP VESOCCLUDE SM WIDE 24/CT (CLIP) IMPLANT
CLIP VESOCCLUDE SM WIDE 6/CT (CLIP) IMPLANT
COVER PROBE W GEL 5X96 (DRAPES) ×3 IMPLANT
COVER WAND RF STERILE (DRAPES) ×3 IMPLANT
DERMABOND ADVANCED (GAUZE/BANDAGES/DRESSINGS) ×2
DERMABOND ADVANCED .7 DNX12 (GAUZE/BANDAGES/DRESSINGS) ×1 IMPLANT
ELECT REM PT RETURN 9FT ADLT (ELECTROSURGICAL) ×3
ELECTRODE REM PT RTRN 9FT ADLT (ELECTROSURGICAL) ×1 IMPLANT
GLOVE BIO SURGEON STRL SZ 6.5 (GLOVE) ×1 IMPLANT
GLOVE BIO SURGEONS STRL SZ 6.5 (GLOVE) ×1
GLOVE BIOGEL PI IND STRL 6.5 (GLOVE) IMPLANT
GLOVE BIOGEL PI IND STRL 7.5 (GLOVE) ×1 IMPLANT
GLOVE BIOGEL PI INDICATOR 6.5 (GLOVE) ×2
GLOVE BIOGEL PI INDICATOR 7.5 (GLOVE) ×4
GLOVE ECLIPSE 7.0 STRL STRAW (GLOVE) ×2 IMPLANT
GLOVE SURG SS PI 7.5 STRL IVOR (GLOVE) ×3 IMPLANT
GOWN STRL REUS W/ TWL LRG LVL3 (GOWN DISPOSABLE) ×2 IMPLANT
GOWN STRL REUS W/ TWL XL LVL3 (GOWN DISPOSABLE) ×1 IMPLANT
GOWN STRL REUS W/TWL LRG LVL3 (GOWN DISPOSABLE) ×4
GOWN STRL REUS W/TWL XL LVL3 (GOWN DISPOSABLE) ×2
HEMOSTAT SNOW SURGICEL 2X4 (HEMOSTASIS) IMPLANT
KIT BASIN OR (CUSTOM PROCEDURE TRAY) ×3 IMPLANT
KIT TURNOVER KIT B (KITS) ×3 IMPLANT
NS IRRIG 1000ML POUR BTL (IV SOLUTION) ×3 IMPLANT
PACK CV ACCESS (CUSTOM PROCEDURE TRAY) ×3 IMPLANT
PAD ARMBOARD 7.5X6 YLW CONV (MISCELLANEOUS) ×6 IMPLANT
SPONGE LAP 18X18 RF (DISPOSABLE) ×2 IMPLANT
SUT PROLENE 6 0 BV (SUTURE) ×2 IMPLANT
SUT PROLENE 6 0 CC (SUTURE) ×3 IMPLANT
SUT SILK 2 0 SH (SUTURE) IMPLANT
SUT VIC AB 3-0 SH 27 (SUTURE) ×4
SUT VIC AB 3-0 SH 27X BRD (SUTURE) ×1 IMPLANT
SUT VICRYL 4-0 PS2 18IN ABS (SUTURE) ×5 IMPLANT
TOWEL GREEN STERILE (TOWEL DISPOSABLE) ×3 IMPLANT
UNDERPAD 30X30 (UNDERPADS AND DIAPERS) ×3 IMPLANT
WATER STERILE IRR 1000ML POUR (IV SOLUTION) ×3 IMPLANT

## 2018-12-23 NOTE — Transfer of Care (Signed)
Immediate Anesthesia Transfer of Care Note  Patient: Gilbert White  Procedure(s) Performed: BASILIC VEIN TRANSPOSITION SECOND STAGE (Left Arm Upper)  Patient Location: PACU  Anesthesia Type:General  Level of Consciousness: awake, alert  and oriented  Airway & Oxygen Therapy: Patient Spontanous Breathing and Patient connected to nasal cannula oxygen  Post-op Assessment: Report given to RN, Post -op Vital signs reviewed and stable and Patient moving all extremities X 4  Post vital signs: Reviewed and stable  Last Vitals:  Vitals Value Taken Time  BP 185/81 12/23/2018 10:53 AM  Temp    Pulse 99 12/23/2018 10:53 AM  Resp 20 12/23/2018 10:54 AM  SpO2 86 % 12/23/2018 10:53 AM  Vitals shown include unvalidated device data.  Last Pain:  Vitals:   12/23/18 0748  TempSrc: Oral  PainSc: 0-No pain         Complications: No apparent anesthesia complications

## 2018-12-23 NOTE — Interval H&P Note (Signed)
History and Physical Interval Note:  12/23/2018 7:28 AM  Gilbert White  has presented today for surgery, with the diagnosis of END STAGE RENAL DISEASE  The various methods of treatment have been discussed with the patient and family. After consideration of risks, benefits and other options for treatment, the patient has consented to  Procedure(s): BASILIC VEIN TRANSPOSITION SECOND STAGE (Left) as a surgical intervention .  The patient's history has been reviewed, patient examined, no change in status, stable for surgery.  I have reviewed the patient's chart and labs.  Questions were answered to the patient's satisfaction.     [name]

## 2018-12-23 NOTE — Discharge Instructions (Signed)
° °  [facility] [place] ° °Discharge Instructions ° °AV Fistula or Graft Surgery for Dialysis Access ° °Please refer to the following instructions for your post-procedure care. Your surgeon or physician assistant will discuss any changes with you. ° °Activity ° °You may drive the day following your surgery, if you are comfortable and no longer taking prescription pain medication. Resume full activity as the soreness in your incision resolves. ° °Bathing/Showering ° °You may shower after you go home. Keep your incision dry for 48 hours. Do not soak in a bathtub, hot tub, or swim until the incision heals completely. You may not shower if you have a hemodialysis catheter. ° °Incision Care ° °Clean your incision with mild soap and water after 48 hours. Pat the area dry with a clean towel. You do not need a bandage unless otherwise instructed. Do not apply any ointments or creams to your incision. You may have skin glue on your incision. Do not peel it off. It will come off on its own in about one week. Your arm may swell a bit after surgery. To reduce swelling use pillows to elevate your arm so it is above your heart. Your doctor will tell you if you need to lightly wrap your arm with an ACE bandage. ° °Diet ° °Resume your normal diet. There are not special food restrictions following this procedure. In order to heal from your surgery, it is CRITICAL to get adequate nutrition. Your body requires vitamins, minerals, and protein. Vegetables are the best source of vitamins and minerals. Vegetables also provide the perfect balance of protein. Processed food has little nutritional value, so try to avoid this. ° °Medications ° °Resume taking all of your medications. If your incision is causing pain, you may take over-the counter pain relievers such as acetaminophen (Tylenol). If you were prescribed a stronger pain medication, please be aware these medications can cause nausea and constipation. Prevent  nausea by taking the medication with a snack or meal. Avoid constipation by drinking plenty of fluids and eating foods with high amount of fiber, such as fruits, vegetables, and grains. Do not take Tylenol if you are taking prescription pain medications. ° ° ° ° °Follow up °Your surgeon may want to see you in the office following your access surgery. If so, this will be arranged at the time of your surgery. ° °Please call us immediately for any of the following conditions: ° °Increased pain, redness, drainage (pus) from your incision site °Fever of 101 degrees or higher °Severe or worsening pain at your incision site °Hand pain or numbness. ° °Reduce your risk of vascular disease: ° °Stop smoking. If you would like help, call [facility] at 1-800-QUIT-NOW (1-800-784-8669) or [place] at 336-586-4000 ° °Manage your cholesterol °Maintain a desired weight °Control your diabetes °Keep your blood pressure down ° °Dialysis ° °It will take several weeks to several months for your new dialysis access to be ready for use. Your surgeon will determine when it is OK to use it. Your nephrologist will continue to direct your dialysis. You can continue to use your Permcath until your new access is ready for use. ° °If you have any questions, please call the office at [phone]. ° °

## 2018-12-23 NOTE — Anesthesia Postprocedure Evaluation (Signed)
Anesthesia Post Note  Patient: Gilbert White  Procedure(s) Performed: BASILIC VEIN TRANSPOSITION SECOND STAGE (Left Arm Upper)     Patient location during evaluation: PACU Anesthesia Type: General Level of consciousness: awake and alert and sedated Pain management: pain level controlled Vital Signs Assessment: post-procedure vital signs reviewed and stable Respiratory status: spontaneous breathing, nonlabored ventilation, respiratory function stable and patient connected to nasal cannula oxygen Cardiovascular status: blood pressure returned to baseline and stable Postop Assessment: no apparent nausea or vomiting Anesthetic complications: no    Last Vitals:  Vitals:   12/23/18 1145 12/23/18 1200  BP: (!) 148/82 (!) 129/59  Pulse: 92 75  Resp: 14 16  Temp:  (!) 36.4 C  SpO2: 94% 95%    Last Pain:  Vitals:   12/23/18 1125  TempSrc:   PainSc: 6                  [name]

## 2018-12-23 NOTE — Telephone Encounter (Signed)
Call and reported small amount of bleeding noted on shirt s/p surgery(BVT) today. Instructed to wash hands and apply clean gauze to site, light pressure and elevate extremity. Apply new gauze over old dressing if bleeding continues and go to the ER at [facility] if acute bleeding. Call this office back in am if problem continues. Verbalized understanding.

## 2018-12-23 NOTE — Anesthesia Procedure Notes (Signed)
Procedure Name: LMA Insertion Date/Time: 12/23/2018 9:25 AM Performed by: Neldon Newport, CRNA Pre-anesthesia Checklist: Timeout performed, Patient being monitored, Suction available, Emergency Drugs available and Patient identified Patient Re-evaluated:Patient Re-evaluated prior to induction Oxygen Delivery Method: Circle system utilized Preoxygenation: Pre-oxygenation with 100% oxygen Induction Type: IV induction Ventilation: Mask ventilation without difficulty LMA: LMA inserted LMA Size: 5.0 Number of attempts: 1 Placement Confirmation: breath sounds checked- equal and bilateral and positive ETCO2 Tube secured with: Tape Dental Injury: Teeth and Oropharynx as per pre-operative assessment

## 2018-12-23 NOTE — Anesthesia Preprocedure Evaluation (Addendum)
Anesthesia Evaluation  Patient identified by MRN, date of birth, ID band Patient awake    Reviewed: Allergy & Precautions, NPO status , Patient's Chart, lab work & pertinent test results  Airway Mallampati: II  TM Distance: >3 FB Neck ROM: Full    Dental  (+) Edentulous Upper, Poor Dentition, Dental Advisory Given   Pulmonary former smoker,    breath sounds clear to auscultation       Cardiovascular hypertension,  Rhythm:Regular Rate:Normal     Neuro/Psych    GI/Hepatic   Endo/Other  diabetes  Renal/GU      Musculoskeletal   Abdominal   Peds  Hematology   Anesthesia Other Findings   Reproductive/Obstetrics                             Anesthesia Physical Anesthesia Plan  ASA: III  Anesthesia Plan: General   Post-op Pain Management:    Induction: Intravenous  PONV Risk Score and Plan: Ondansetron  Airway Management Planned: LMA  Additional Equipment:   Intra-op Plan:   Post-operative Plan:   Informed Consent: I have reviewed the patients History and Physical, chart, labs and discussed the procedure including the risks, benefits and alternatives for the proposed anesthesia with the patient or authorized representative who has indicated his/her understanding and acceptance.   Dental advisory given  Plan Discussed with: CRNA and Anesthesiologist  Anesthesia Plan Comments: (ESRD last HD 12/22/18 K-3.9 Type 2 DM glucose- 119  Plan GA with LMA)       Anesthesia Quick Evaluation

## 2018-12-23 NOTE — Telephone Encounter (Signed)
sch appt spk to pt mld ltr 01/24/2019 1pm p/o PA

## 2018-12-23 NOTE — Op Note (Signed)
    Patient name: Gilbert White MRN: [id number] DOB: 1946-05-09 Sex: male  12/23/2018 Pre-operative Diagnosis: End-stage renal disease Post-operative diagnosis:  Same Surgeon:  [name] Assistants: Laurence Slate Procedure:   Second stage left basilic vein fistula creation Anesthesia: General Blood Loss: 50 cc Specimens: None  Findings: Excellent appearing vein, greater than 5 mm throughout  Indications: The patient has previously undergone for stage basilic vein fistula creation.  6-week follow-up duplex ultrasound showed an excellent caliber vein.  He comes in today for the second stage.  Procedure:  The patient was identified in the holding area and taken to [place] 11  The patient was then placed supine on the table. general anesthesia was administered.  The patient was prepped and draped in the usual sterile fashion.  A time out was called and antibiotics were administered.  Ultrasound was used to map the course of the basilic vein in the upper arm.  I made 2 longitudinal incisions on the medial aspect of the upper arm directly anterior to the vein.  Through these incisions, the vein was fully mobilized.  All side branches were ligated between silk ties.  The nerve was protected throughout.  Once I had fully mobilized the vein from the antecubital crease up to the axilla, it was marked for orientation.  A subcutaneous tunnel was then created using a curved Gore tunneler.  Baby Gregory clamps were placed on the vein proximally and distally and the vein was ligated near the antecubital crease.  The vein was then brought through the previously created tunnel, making sure to maintain proper orientation.  A end to end anastomosis was then created using running 6-0 Prolene.  Prior to completion the appropriate flushing maneuvers were performed and the anastomosis was completed.  The clamps were then released.  There was an excellent thrill within the fistula.  The wound was then irrigated and  hemostasis was achieved.  The incision was closed with 2 layers of 3-0 Vicryl, followed by Dermabond.  There were no immediate complications.   Disposition: To PACU stable.   Theotis Burrow, M.D. [facility] [place] Office: [phone] Pager:  [phone]

## 2018-12-23 NOTE — Telephone Encounter (Signed)
-----   Message from Ulyses Amor, [place] sent at 12/23/2018 10:47 AM EST -----  S/P second stage basilic vein transposition L UE.  F/U in [facility] clinic in 4 weeks for exam on a Monday

## 2018-12-24 ENCOUNTER — Encounter (HOSPITAL_COMMUNITY): Payer: Self-pay | Admitting: Surgery

## 2019-01-17 ENCOUNTER — Encounter: Payer: Medicare Other | Admitting: Family

## 2019-01-17 ENCOUNTER — Encounter: Payer: Self-pay | Admitting: Family

## 2019-01-17 NOTE — Progress Notes (Deleted)
  POST OPERATIVE OFFICE NOTE    CC:  F/u for surgery  HPI:  This is a 73 y.o. male who is s/p 2nd stage BVT on 12/23/2018 by Dr. Trula Slade.  His first stage left BVT was on 10/21/18 also by Dr. Trula Slade.   He was seen back on 11/29/18 and at that time, he denied any pain or numbness in his left hand.  He presents today for ***  He dialyzes M/W/F at the [facility] location.  No Known Allergies  Current Outpatient Medications  Medication Sig Dispense Refill  . acetaminophen (TYLENOL) 500 MG tablet Take 500 mg by mouth every 6 (six) hours as needed for moderate pain or headache.    Marland Kitchen amLODipine (NORVASC) 5 MG tablet Take 5 mg by mouth daily.    Marland Kitchen aspirin 81 MG tablet Take 81 mg by mouth daily.     . calcium carbonate (TUMS EX) 750 MG chewable tablet Chew 1 tablet by mouth 3 (three) times daily with meals.    Marland Kitchen HYDROcodone-acetaminophen (NORCO) 5-325 MG tablet Take 1 tablet by mouth every 6 (six) hours as needed for moderate pain. 12 tablet 0  . ipratropium-albuterol (DUONEB) 0.5-2.5 (3) MG/3ML SOLN Take 3 mLs by nebulization daily.    . metoprolol tartrate (LOPRESSOR) 25 MG tablet Take 25 mg by mouth See admin instructions. Take 25 mg by mouth twice daily on Monday, Wednesday and Friday. Take 25 mg by mouth 3 times daily on all other days    . mirtazapine (REMERON) 30 MG tablet Take 30 mg by mouth at bedtime.    Marland Kitchen NOVOLIN 70/30 RELION (70-30) 100 UNIT/ML injection Inject 35 Units into the skin 2 (two) times daily.  0  . Omega-3 Fatty Acids (FISH OIL) 1000 MG CAPS Take 2,000 mg by mouth 2 (two) times daily.    . pantoprazole (PROTONIX) 40 MG tablet Take 40 mg by mouth daily.    . sertraline (ZOLOFT) 50 MG tablet Take 50 mg by mouth at bedtime.     No current facility-administered medications for this visit.      ROS:  See HPI  Physical Exam:  ***  Incision:  *** Extremities:  *** Neuro: *** Abdomen:  ***  Assessment/Plan:  This is a 73 y.o. male who is s/p: 2nd stage BVT on  12/23/2018 by Dr. Trula Slade.  His first stage left BVT was on 10/21/18 also by Dr. Trula Slade.  -***   Leontine Locket, PA-C [facility] [phone]  Clinic MD:  Trula Slade

## 2019-01-18 ENCOUNTER — Encounter: Payer: Self-pay | Admitting: Family

## 2019-01-18 ENCOUNTER — Other Ambulatory Visit: Payer: Self-pay | Admitting: *Deleted

## 2019-01-18 ENCOUNTER — Ambulatory Visit (INDEPENDENT_AMBULATORY_CARE_PROVIDER_SITE_OTHER): Payer: Self-pay | Admitting: Physician Assistant

## 2019-01-18 ENCOUNTER — Encounter: Payer: Self-pay | Admitting: *Deleted

## 2019-01-18 DIAGNOSIS — T82898A Other specified complication of vascular prosthetic devices, implants and grafts, initial encounter: Secondary | ICD-10-CM

## 2019-01-18 DIAGNOSIS — N186 End stage renal disease: Secondary | ICD-10-CM

## 2019-01-18 DIAGNOSIS — Z992 Dependence on renal dialysis: Secondary | ICD-10-CM

## 2019-01-18 MED ORDER — CEPHALEXIN 500 MG PO CAPS: 500.0000 mg | ORAL_CAPSULE | Freq: Every day | ORAL | 0 refills | Status: AC

## 2019-01-18 NOTE — Progress Notes (Signed)
POST OPERATIVE OFFICE NOTE    CC:  F/u for surgery  HPI:  This is a 73 y.o. 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  He presents today for follow up.  He has a tunneled catheter that was placed by IR on 10/18/18.  He presents today with c/o numbness and pain in his left and and arm.  He states that his hand will go numb.  He was drinking a drink the other day and dropped it and didn't realize it.  He states this is the worst pain he has had.  He states he is having trouble moving his left thumb.    He was nervous he was having a heart attack.  He states he did not have this pain with the 1st stage BVT but started after the 2nd stage.  He also states that he thinks one of his incisions is infected.  He says it has had fever in it.   No Known Allergies  Current Outpatient Medications  Medication Sig Dispense Refill  . acetaminophen (TYLENOL) 500 MG tablet Take 500 mg by mouth every 6 (six) hours as needed for moderate pain or headache.    Marland Kitchen amLODipine (NORVASC) 5 MG tablet Take 5 mg by mouth daily.    Marland Kitchen aspirin 81 MG tablet Take 81 mg by mouth daily.     . calcium carbonate (TUMS EX) 750 MG chewable tablet Chew 1 tablet by mouth 3 (three) times daily with meals.    Marland Kitchen HYDROcodone-acetaminophen (NORCO) 5-325 MG tablet Take 1 tablet by mouth every 6 (six) hours as needed for moderate pain. 12 tablet 0  . ipratropium-albuterol (DUONEB) 0.5-2.5 (3) MG/3ML SOLN Take 3 mLs by nebulization daily.    . metoprolol tartrate (LOPRESSOR) 25 MG tablet Take 25 mg by mouth See admin instructions. Take 25 mg by mouth twice daily on Monday, Wednesday and Friday. Take 25 mg by mouth 3 times daily on all other days    . mirtazapine (REMERON) 30 MG tablet Take 30 mg by mouth at bedtime.    Marland Kitchen NOVOLIN 70/30 RELION (70-30) 100 UNIT/ML injection Inject 35 Units into the skin 2 (two) times daily.  0  . Omega-3 Fatty Acids (FISH OIL) 1000 MG CAPS Take 2,000 mg by mouth 2  (two) times daily.    . pantoprazole (PROTONIX) 40 MG tablet Take 40 mg by mouth daily.    . sertraline (ZOLOFT) 50 MG tablet Take 50 mg by mouth at bedtime.     No current facility-administered medications for this visit.      ROS:  See HPI  Physical Exam:   Incision:  Proximal incision with erythema; no drainage.  Other incisions look fine.  Extremities:  Unable to palpate left radial pulse.  There is a faint doppler signal present and augments with compression of the fistula.  There is a palmar arch doppler signal. Right radial pulse is palpable   Assessment/Plan:  This is a 73 y.o. 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 Trula Slade on 12/23/2018 who presents today with pain in left hand and arm.    -pt with steal sx and is having numbness and extreme pain in his left hand and arm.  He does have some decreased motor/sensation of his left thumb.  Pt examined with Dr. Donnetta Hutching and it is felt the fistula needs to be ligated.  Pt and wife are in agreement.   -pt  lives in [place].  Dr. Donnetta Hutching spoke with pt that we will ligate fistula on Thursday, but should be able to coordinate care for new access with physicians in [place] going forward. His nephrologist is Dr. Maryann Alar Hyppolite who goes to [facility] once a week to see pts (originally in [place]). -he is given Keflex 500mg  daily in the afternoon (discussed dosing with pharmacist at [facility]).  He is given 5 tablets and understands to take this in the evening and after dialysis on those days.  He understands to take today and tomorrow and will get IV abx on Thursday with surgery.     Leontine Locket, PA-C [facility] [phone]  Clinic MD:  Early

## 2019-01-18 NOTE — H&P (View-Only) (Signed)
POST OPERATIVE OFFICE NOTE    CC:  F/u for surgery  HPI:  This is a 73 y.o. 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  He presents today for follow up.  He has a tunneled catheter that was placed by IR on 10/18/18.  He presents today with c/o numbness and pain in his left and and arm.  He states that his hand will go numb.  He was drinking a drink the other day and dropped it and didn't realize it.  He states this is the worst pain he has had.  He states he is having trouble moving his left thumb.    He was nervous he was having a heart attack.  He states he did not have this pain with the 1st stage BVT but started after the 2nd stage.  He also states that he thinks one of his incisions is infected.  He says it has had fever in it.   No Known Allergies  Current Outpatient Medications  Medication Sig Dispense Refill  . acetaminophen (TYLENOL) 500 MG tablet Take 500 mg by mouth every 6 (six) hours as needed for moderate pain or headache.    Marland Kitchen amLODipine (NORVASC) 5 MG tablet Take 5 mg by mouth daily.    Marland Kitchen aspirin 81 MG tablet Take 81 mg by mouth daily.     . calcium carbonate (TUMS EX) 750 MG chewable tablet Chew 1 tablet by mouth 3 (three) times daily with meals.    Marland Kitchen HYDROcodone-acetaminophen (NORCO) 5-325 MG tablet Take 1 tablet by mouth every 6 (six) hours as needed for moderate pain. 12 tablet 0  . ipratropium-albuterol (DUONEB) 0.5-2.5 (3) MG/3ML SOLN Take 3 mLs by nebulization daily.    . metoprolol tartrate (LOPRESSOR) 25 MG tablet Take 25 mg by mouth See admin instructions. Take 25 mg by mouth twice daily on Monday, Wednesday and Friday. Take 25 mg by mouth 3 times daily on all other days    . mirtazapine (REMERON) 30 MG tablet Take 30 mg by mouth at bedtime.    Marland Kitchen NOVOLIN 70/30 RELION (70-30) 100 UNIT/ML injection Inject 35 Units into the skin 2 (two) times daily.  0  . Omega-3 Fatty Acids (FISH OIL) 1000 MG CAPS Take 2,000 mg by mouth 2  (two) times daily.    . pantoprazole (PROTONIX) 40 MG tablet Take 40 mg by mouth daily.    . sertraline (ZOLOFT) 50 MG tablet Take 50 mg by mouth at bedtime.     No current facility-administered medications for this visit.      ROS:  See HPI  Physical Exam:   Incision:  Proximal incision with erythema; no drainage.  Other incisions look fine.  Extremities:  Unable to palpate left radial pulse.  There is a faint doppler signal present and augments with compression of the fistula.  There is a palmar arch doppler signal. Right radial pulse is palpable   Assessment/Plan:  This is a 73 y.o. 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 Trula Slade on 12/23/2018 who presents today with pain in left hand and arm.    -pt with steal sx and is having numbness and extreme pain in his left hand and arm.  He does have some decreased motor/sensation of his left thumb.  Pt examined with Dr. Donnetta Hutching and it is felt the fistula needs to be ligated.  Pt and wife are in agreement.   -pt  lives in [place].  Dr. Donnetta Hutching spoke with pt that we will ligate fistula on Thursday, but should be able to coordinate care for new access with physicians in [place] going forward. His nephrologist is Dr. Maryann Alar Hyppolite who goes to [facility] once a week to see pts (originally in [place]). -he is given Keflex 500mg  daily in the afternoon (discussed dosing with pharmacist at [facility]).  He is given 5 tablets and understands to take this in the evening and after dialysis on those days.  He understands to take today and tomorrow and will get IV abx on Thursday with surgery.     Leontine Locket, PA-C [facility] [phone]  Clinic MD:  Early

## 2019-01-19 ENCOUNTER — Encounter (HOSPITAL_COMMUNITY): Payer: Self-pay

## 2019-01-19 ENCOUNTER — Other Ambulatory Visit: Payer: Self-pay

## 2019-01-19 NOTE — Progress Notes (Signed)
Pre-op phone call complete. Patient advised to take amlodipine, metoprolol and pantoprazole morning of surgery. Can take tylenol, duoneb and oxycodone if needed.  Patient instructed to take 28u of Novolin this evening, no insulin in the morning. Aware nothing to eat or drink after midnight. Confirmed patient to arrive 01/20/19 at 0700. Patient denies shortness of breath, fever, cough and chest pain.

## 2019-01-20 ENCOUNTER — Ambulatory Visit (HOSPITAL_COMMUNITY): Payer: Medicare Other | Admitting: Physician Assistant

## 2019-01-20 ENCOUNTER — Ambulatory Visit (HOSPITAL_COMMUNITY)
Admission: RE | Admit: 2019-01-20 | Discharge: 2019-01-20 | Disposition: A | Discharge status: Home / Self Care | Payer: Medicare Other | Attending: Surgery | Admitting: Surgery

## 2019-01-20 ENCOUNTER — Encounter (HOSPITAL_COMMUNITY)
Admission: RE | Disposition: A | Discharge status: Home / Self Care | Payer: Self-pay | Source: Home / Self Care | Attending: Surgery

## 2019-01-20 ENCOUNTER — Encounter (HOSPITAL_COMMUNITY): Payer: Self-pay | Admitting: Certified Registered Nurse Anesthetist

## 2019-01-20 ENCOUNTER — Telehealth: Payer: Self-pay | Admitting: Surgery

## 2019-01-20 DIAGNOSIS — G4730 Sleep apnea, unspecified: Secondary | ICD-10-CM | POA: Diagnosis not present

## 2019-01-20 DIAGNOSIS — Z794 Long term (current) use of insulin: Secondary | ICD-10-CM | POA: Diagnosis not present

## 2019-01-20 DIAGNOSIS — Z87891 Personal history of nicotine dependence: Secondary | ICD-10-CM | POA: Diagnosis not present

## 2019-01-20 DIAGNOSIS — K219 Gastro-esophageal reflux disease without esophagitis: Secondary | ICD-10-CM | POA: Diagnosis not present

## 2019-01-20 DIAGNOSIS — J449 Chronic obstructive pulmonary disease, unspecified: Secondary | ICD-10-CM | POA: Insufficient documentation

## 2019-01-20 DIAGNOSIS — Y832 Surgical operation with anastomosis, bypass or graft as the cause of abnormal reaction of the patient, or of later complication, without mention of misadventure at the time of the procedure: Secondary | ICD-10-CM | POA: Insufficient documentation

## 2019-01-20 DIAGNOSIS — Z79899 Other long term (current) drug therapy: Secondary | ICD-10-CM | POA: Insufficient documentation

## 2019-01-20 DIAGNOSIS — Z992 Dependence on renal dialysis: Secondary | ICD-10-CM | POA: Diagnosis not present

## 2019-01-20 DIAGNOSIS — I120 Hypertensive chronic kidney disease with stage 5 chronic kidney disease or end stage renal disease: Secondary | ICD-10-CM | POA: Diagnosis not present

## 2019-01-20 DIAGNOSIS — T82898A Other specified complication of vascular prosthetic devices, implants and grafts, initial encounter: Secondary | ICD-10-CM | POA: Insufficient documentation

## 2019-01-20 DIAGNOSIS — Z7982 Long term (current) use of aspirin: Secondary | ICD-10-CM | POA: Insufficient documentation

## 2019-01-20 DIAGNOSIS — E1122 Type 2 diabetes mellitus with diabetic chronic kidney disease: Secondary | ICD-10-CM | POA: Diagnosis not present

## 2019-01-20 DIAGNOSIS — N186 End stage renal disease: Secondary | ICD-10-CM | POA: Insufficient documentation

## 2019-01-20 HISTORY — PX: LIGATION OF ARTERIOVENOUS  FISTULA: SHX5948

## 2019-01-20 LAB — POCT I-STAT 4, (NA,K, GLUC, HGB,HCT)
Glucose, Bld: 108 mg/dL — ABNORMAL HIGH (ref 70–99)
HCT: 40 % (ref 39.0–52.0)
Hemoglobin: 13.6 g/dL (ref 13.0–17.0)
Potassium: 4.6 mmol/L (ref 3.5–5.1)
Sodium: 139 mmol/L (ref 135–145)

## 2019-01-20 LAB — GLUCOSE, CAPILLARY: Glucose-Capillary: 100 mg/dL — ABNORMAL HIGH (ref 70–99)

## 2019-01-20 SURGERY — LIGATION OF ARTERIOVENOUS  FISTULA
Anesthesia: Monitor Anesthesia Care | Laterality: Left

## 2019-01-20 MED ORDER — SODIUM CHLORIDE 0.9 % IV SOLN
INTRAVENOUS | Status: AC
  Filled 2019-01-20: qty 1.2

## 2019-01-20 MED ORDER — PROPOFOL 500 MG/50ML IV EMUL
INTRAVENOUS | Status: DC | PRN
  Administered 2019-01-20: 75 ug/kg/min via INTRAVENOUS

## 2019-01-20 MED ORDER — PROPOFOL 1000 MG/100ML IV EMUL
INTRAVENOUS | Status: AC
  Filled 2019-01-20: qty 100

## 2019-01-20 MED ORDER — PHENYLEPHRINE 40 MCG/ML (10ML) SYRINGE FOR IV PUSH (FOR BLOOD PRESSURE SUPPORT)
PREFILLED_SYRINGE | INTRAVENOUS | Status: DC | PRN
  Administered 2019-01-20: 120 ug via INTRAVENOUS
  Administered 2019-01-20: 80 ug via INTRAVENOUS

## 2019-01-20 MED ORDER — SODIUM CHLORIDE 0.9 % IV SOLN
INTRAVENOUS | Status: DC | PRN
  Administered 2019-01-20: 500 mL

## 2019-01-20 MED ORDER — CHLORHEXIDINE GLUCONATE 4 % EX LIQD: 60.0000 mL | Freq: Once | CUTANEOUS | Status: DC

## 2019-01-20 MED ORDER — FENTANYL CITRATE (PF) 100 MCG/2ML IJ SOLN
INTRAMUSCULAR | Status: DC | PRN
  Administered 2019-01-20 (×2): 25 ug via INTRAVENOUS

## 2019-01-20 MED ORDER — CEFAZOLIN SODIUM-DEXTROSE 2-4 GM/100ML-% IV SOLN
2.0000 g | INTRAVENOUS | Status: AC
  Administered 2019-01-20: 2 g via INTRAVENOUS

## 2019-01-20 MED ORDER — 0.9 % SODIUM CHLORIDE (POUR BTL) OPTIME
TOPICAL | Status: DC | PRN
  Administered 2019-01-20: 1000 mL

## 2019-01-20 MED ORDER — OXYCODONE-ACETAMINOPHEN 5-325 MG PO TABS: 1.0000 | ORAL_TABLET | ORAL | 0 refills | Status: AC | PRN

## 2019-01-20 MED ORDER — SODIUM CHLORIDE 0.9 % IV SOLN
INTRAVENOUS | Status: DC
  Administered 2019-01-20: 08:00:00 via INTRAVENOUS

## 2019-01-20 MED ORDER — ONDANSETRON HCL 4 MG/2ML IJ SOLN
INTRAMUSCULAR | Status: AC
  Filled 2019-01-20: qty 2

## 2019-01-20 MED ORDER — LIDOCAINE 2% (20 MG/ML) 5 ML SYRINGE
INTRAMUSCULAR | Status: DC | PRN
  Administered 2019-01-20: 40 mg via INTRAVENOUS

## 2019-01-20 MED ORDER — CEFAZOLIN SODIUM-DEXTROSE 2-4 GM/100ML-% IV SOLN
INTRAVENOUS | Status: AC
  Filled 2019-01-20: qty 100

## 2019-01-20 MED ORDER — ACETAMINOPHEN 500 MG PO TABS
ORAL_TABLET | ORAL | Status: AC
  Filled 2019-01-20: qty 2

## 2019-01-20 MED ORDER — ONDANSETRON HCL 4 MG/2ML IJ SOLN
INTRAMUSCULAR | Status: DC | PRN
  Administered 2019-01-20: 4 mg via INTRAVENOUS

## 2019-01-20 MED ORDER — LIDOCAINE-EPINEPHRINE (PF) 1 %-1:200000 IJ SOLN
INTRAMUSCULAR | Status: AC
  Filled 2019-01-20: qty 30

## 2019-01-20 MED ORDER — LIDOCAINE 2% (20 MG/ML) 5 ML SYRINGE
INTRAMUSCULAR | Status: AC
  Filled 2019-01-20: qty 5

## 2019-01-20 MED ORDER — FENTANYL CITRATE (PF) 250 MCG/5ML IJ SOLN
INTRAMUSCULAR | Status: AC
  Filled 2019-01-20: qty 5

## 2019-01-20 SURGICAL SUPPLY — 35 items
CANISTER SUCT 3000ML PPV (MISCELLANEOUS) ×3 IMPLANT
CLIP VESOCCLUDE MED 6/CT (CLIP) ×3 IMPLANT
CLIP VESOCCLUDE SM WIDE 6/CT (CLIP) ×3 IMPLANT
COVER WAND RF STERILE (DRAPES) IMPLANT
DERMABOND ADVANCED (GAUZE/BANDAGES/DRESSINGS) ×4
DERMABOND ADVANCED .7 DNX12 (GAUZE/BANDAGES/DRESSINGS) ×2 IMPLANT
ELECT REM PT RETURN 9FT ADLT (ELECTROSURGICAL) ×3
ELECTRODE REM PT RTRN 9FT ADLT (ELECTROSURGICAL) ×1 IMPLANT
GLOVE BIO SURGEON STRL SZ 6.5 (GLOVE) ×2 IMPLANT
GLOVE BIO SURGEONS STRL SZ 6.5 (GLOVE) ×1
GLOVE BIOGEL PI IND STRL 6.5 (GLOVE) ×1 IMPLANT
GLOVE BIOGEL PI IND STRL 7.5 (GLOVE) ×1 IMPLANT
GLOVE BIOGEL PI INDICATOR 6.5 (GLOVE) ×2
GLOVE BIOGEL PI INDICATOR 7.5 (GLOVE) ×2
GLOVE ECLIPSE 7.5 STRL STRAW (GLOVE) ×6 IMPLANT
GLOVE SURG SS PI 7.5 STRL IVOR (GLOVE) ×6 IMPLANT
GOWN STRL REUS W/ TWL LRG LVL3 (GOWN DISPOSABLE) ×2 IMPLANT
GOWN STRL REUS W/ TWL XL LVL3 (GOWN DISPOSABLE) ×2 IMPLANT
GOWN STRL REUS W/TWL LRG LVL3 (GOWN DISPOSABLE) ×4
GOWN STRL REUS W/TWL XL LVL3 (GOWN DISPOSABLE) ×4
HEMOSTAT SNOW SURGICEL 2X4 (HEMOSTASIS) IMPLANT
KIT BASIN OR (CUSTOM PROCEDURE TRAY) ×3 IMPLANT
KIT TURNOVER KIT B (KITS) ×3 IMPLANT
NS IRRIG 1000ML POUR BTL (IV SOLUTION) ×3 IMPLANT
PACK CV ACCESS (CUSTOM PROCEDURE TRAY) ×3 IMPLANT
PAD ARMBOARD 7.5X6 YLW CONV (MISCELLANEOUS) ×6 IMPLANT
SUT ETHILON 3 0 PS 1 (SUTURE) IMPLANT
SUT PROLENE 6 0 BV (SUTURE) ×3 IMPLANT
SUT SILK 0 TIES 10X30 (SUTURE) ×3 IMPLANT
SUT VIC AB 3-0 SH 27 (SUTURE) ×2
SUT VIC AB 3-0 SH 27X BRD (SUTURE) ×1 IMPLANT
SUT VICRYL 4-0 PS2 18IN ABS (SUTURE) ×3 IMPLANT
TOWEL GREEN STERILE (TOWEL DISPOSABLE) ×3 IMPLANT
UNDERPAD 30X30 (UNDERPADS AND DIAPERS) ×3 IMPLANT
WATER STERILE IRR 1000ML POUR (IV SOLUTION) ×3 IMPLANT

## 2019-01-20 NOTE — Anesthesia Preprocedure Evaluation (Addendum)
Anesthesia Evaluation  Patient identified by MRN, date of birth, ID band Patient awake    Reviewed: Allergy & Precautions, NPO status , Patient's Chart, lab work & pertinent test results  Airway Mallampati: II  TM Distance: >3 FB     Dental   Pulmonary sleep apnea , COPD, former smoker,    breath sounds clear to auscultation       Cardiovascular hypertension,  Rhythm:Regular Rate:Normal     Neuro/Psych    GI/Hepatic Neg liver ROS, GERD  ,  Endo/Other  diabetes  Renal/GU Renal disease     Musculoskeletal   Abdominal   Peds  Hematology   Anesthesia Other Findings   Reproductive/Obstetrics                             Anesthesia Physical Anesthesia Plan  ASA: III  Anesthesia Plan: MAC   Post-op Pain Management:    Induction: Intravenous  PONV Risk Score and Plan: Ondansetron and Midazolam  Airway Management Planned: Nasal Cannula and Simple Face Mask  Additional Equipment:   Intra-op Plan:   Post-operative Plan:   Informed Consent: I have reviewed the patients History and Physical, chart, labs and discussed the procedure including the risks, benefits and alternatives for the proposed anesthesia with the patient or authorized representative who has indicated his/her understanding and acceptance.     Dental advisory given  Plan Discussed with: CRNA and Anesthesiologist  Anesthesia Plan Comments:         Anesthesia Quick Evaluation

## 2019-01-20 NOTE — Telephone Encounter (Signed)
-----   Message from Penni Homans, RN sent at 01/20/2019  3:19 PM EST ----- Regarding: f/u appointment  ----- Message ----- From: Serafina Mitchell, MD Sent: 01/20/2019   2:12 PM EST To: [facility] Charge Pool  01/20/2019:  Surgeon:  [name] Assistants: None Procedure:   Ligation of left basilic vein fistula   Follow-up PA clinic 2-3 weeks

## 2019-01-20 NOTE — Anesthesia Postprocedure Evaluation (Signed)
Anesthesia Post Note  Patient: Gilbert White  Procedure(s) Performed: LIGATION OF ARTERIOVENOUS  FISTULA LEFT ARM (Left )     Patient location during evaluation: PACU Anesthesia Type: MAC Level of consciousness: awake Pain management: pain level controlled Vital Signs Assessment: post-procedure vital signs reviewed and stable Respiratory status: spontaneous breathing Cardiovascular status: stable Postop Assessment: no apparent nausea or vomiting Anesthetic complications: no    Last Vitals:  Vitals:   01/20/19 0724 01/20/19 1007  BP: 107/60   Pulse: 76   Temp: 36.5 C 36.6 C  SpO2: 97%     Last Pain:  Vitals:   01/20/19 1007  TempSrc:   PainSc: 0-No pain                 Reign Bartnick

## 2019-01-20 NOTE — Anesthesia Procedure Notes (Signed)
Procedure Name: MAC Date/Time: 01/20/2019 9:08 AM Performed by: Candis Shine, CRNA Pre-anesthesia Checklist: Patient identified, Emergency Drugs available, Suction available, Patient being monitored and Timeout performed Patient Re-evaluated:Patient Re-evaluated prior to induction Oxygen Delivery Method: Simple face mask Dental Injury: Teeth and Oropharynx as per pre-operative assessment

## 2019-01-20 NOTE — Op Note (Signed)
    Patient name: Gilbert White MRN: [id number] DOB: Aug 26, 1946 Sex: male  01/20/2019 Pre-operative Diagnosis: ESRD, steal syndrome Post-operative diagnosis:  Same Surgeon:  [name] Assistants: None Procedure:   Ligation of left basilic vein fistula Anesthesia: MAC Blood Loss: Minimal Specimens: None  Findings: Slight improvement in palmar arch Doppler single after fistula ligation.  Indications: The patient has previously undergone a second stage basilic vein fistula creation.  He did not have steal symptoms after the first days but is now having symptoms consistent with steal.  He was seen in the office yesterday and ligation was recommended.  I discussed with the patient in the holding area that I would consider banding if I felt it was appropriate.  Procedure:  The patient was identified in the holding area and taken to [place]  The patient was then placed supine on the table. MAC anesthesia was administered.  The patient was prepped and draped in the usual sterile fashion.  A time out was called and antibiotics were administered.  1% lidocaine was used local anesthesia.  The patient's previous incision in the antecubital crease was opened with a 10 blade.  Sharp dissection was used to expose the anastomosis between the basilic vein and the brachial artery.  Circumferential control of the vein was obtained.  I then obtained a Doppler.  Without fistula compression there was still a biphasic signal in the radial artery and palmar arch.  With fistula compression this did improve slightly.  I have tried to occlude half of the fistula to simulate banding, and could not hear a significant change in the Doppler signal after these maneuvers.  Therefore felt ligation was most appropriate.  A 2-0 silk tie was used distally on the vein and the proximal vein was occluded with a baby Gregory clamp.  I then divided the vein.  The cuff of vein on the arterial side was oversewn with 2 layers of  6-0 Prolene.  Wound was then irrigated and closed with 2 layers of 3-0 Vicryl.  The patient did have some erythema along the proximal incision from his recent basilic vein harvest.  I milked the area and got a drop of pus out.  I therefore opened this incision slightly, about 4 mm.  There was no further purulence.  I suspect that this was a small stitch abscess.  Dermabond and sterile dressings were applied.  There were no immediate complications.   Disposition: To PACU stable.   Theotis Burrow, M.D. [facility] [place] Office: [phone] Pager:  [phone]

## 2019-01-20 NOTE — Transfer of Care (Signed)
Immediate Anesthesia Transfer of Care Note  Patient: Gilbert White  Procedure(s) Performed: LIGATION OF ARTERIOVENOUS  FISTULA LEFT ARM (Left )  Patient Location: PACU  Anesthesia Type:MAC  Level of Consciousness: awake, alert  and oriented  Airway & Oxygen Therapy: Patient Spontanous Breathing and Patient connected to face mask oxygen  Post-op Assessment: Report given to RN and Post -op Vital signs reviewed and stable  Post vital signs: Reviewed and stable  Last Vitals:  Vitals Value Taken Time  BP 124/83 01/20/2019 10:08 AM  Temp    Pulse 73 01/20/2019 10:09 AM  Resp 17 01/20/2019 10:09 AM  SpO2 100 % 01/20/2019 10:09 AM  Vitals shown include unvalidated device data.  Last Pain:  Vitals:   01/20/19 0724  TempSrc: Oral      Patients Stated Pain Goal: 2 (26/20/35 5974)  Complications: No apparent anesthesia complications

## 2019-01-20 NOTE — Telephone Encounter (Signed)
sch appt spk to pt mld ltr 02/14/2019 330pm p/o MD

## 2019-01-20 NOTE — Discharge Instructions (Signed)
° °  [facility]  Discharge Instructions  AV Fistula or Graft Surgery for Dialysis Access  Please refer to the following instructions for your post-procedure care. Your surgeon or physician assistant will discuss any changes with you.  Activity  You may drive the day following your surgery, if you are comfortable and no longer taking prescription pain medication. Resume full activity as the soreness in your incision resolves.  Bathing/Showering  You may shower after you go home. Keep your incision dry for 48 hours. Do not soak in a bathtub, hot tub, or swim until the incision heals completely. You may not shower if you have a hemodialysis catheter.  Incision Care  Clean your incision with mild soap and water after 48 hours. Pat the area dry with a clean towel. You do not need a bandage unless otherwise instructed. Do not apply any ointments or creams to your incision. You may have skin glue on your incision. Do not peel it off. It will come off on its own in about one week. Your arm may swell a bit after surgery. To reduce swelling use pillows to elevate your arm so it is above your heart. Your doctor will tell you if you need to lightly wrap your arm with an ACE bandage.  Diet  Resume your normal diet. There are not special food restrictions following this procedure. In order to heal from your surgery, it is CRITICAL to get adequate nutrition. Your body requires vitamins, minerals, and protein. Vegetables are the best source of vitamins and minerals. Vegetables also provide the perfect balance of protein. Processed food has little nutritional value, so try to avoid this.  Medications  Resume taking all of your medications. If your incision is causing pain, you may take over-the counter pain relievers such as acetaminophen (Tylenol). If you were prescribed a stronger pain medication, please be aware these medications can cause nausea and constipation. Prevent  nausea by taking the medication with a snack or meal. Avoid constipation by drinking plenty of fluids and eating foods with high amount of fiber, such as fruits, vegetables, and grains.  Do not take Tylenol if you are taking prescription pain medications.  Follow up Your surgeon may want to see you in the office following your access surgery. If so, this will be arranged at the time of your surgery.  Please call us immediately for any of the following conditions:  Increased pain, redness, drainage (pus) from your incision site Fever of 101 degrees or higher Severe or worsening pain at your incision site Hand pain or numbness.  Reduce your risk of vascular disease:  Stop smoking. If you would like help, call [facility] at 1-800-QUIT-NOW 825-718-0719) or [place] at [place] your cholesterol Maintain a desired weight Control your diabetes Keep your blood pressure down  Dialysis  It will take several weeks to several months for your new dialysis access to be ready for use. Your surgeon will determine when it is okay to use it. Your nephrologist will continue to direct your dialysis. You can continue to use your Permcath until your new access is ready for use.   01/20/2019 Gilbert White [id number] Jul 04, 1946  Surgeon(s): Serafina Mitchell, MD

## 2019-01-20 NOTE — Interval H&P Note (Signed)
History and Physical Interval Note:  01/20/2019 8:50 AM  [name]  has presented today for surgery, with the diagnosis of COMPLICATION WITH ARTERIOVENOUS FISTULA LEFT ARM  The various methods of treatment have been discussed with the patient and family. After consideration of risks, benefits and other options for treatment, the patient has consented to  Procedure(s): LIGATION OF ARTERIOVENOUS  FISTULA LEFT ARM (Left) as a surgical intervention .  The patient's history has been reviewed, patient examined, no change in status, stable for surgery.  I have reviewed the patient's chart and labs.  Questions were answered to the patient's satisfaction.     Wells Gabrielle Wak[place]  Discussed the possibility of banding vs ligation  WElls Edythe Riches

## 2019-01-21 ENCOUNTER — Telehealth: Payer: Self-pay | Admitting: Surgery

## 2019-01-21 ENCOUNTER — Encounter (HOSPITAL_COMMUNITY): Payer: Self-pay | Admitting: Surgery

## 2019-01-21 NOTE — Telephone Encounter (Signed)
-----   Message from Penni Homans, RN sent at 01/20/2019  3:19 PM EST ----- Regarding: f/u appointment  ----- Message ----- From: Serafina Mitchell, MD Sent: 01/20/2019   2:12 PM EST To: [facility] Charge Pool  01/20/2019:  Surgeon:  [name] Assistants: None Procedure:   Ligation of left basilic vein fistula   Follow-up PA clinic 2-3 weeks

## 2019-01-21 NOTE — Telephone Encounter (Signed)
sch appt phone NA mld ltr 02/14/2019 330pm p/o MD

## 2019-01-25 ENCOUNTER — Encounter: Payer: Medicare Other | Admitting: Family

## 2019-01-25 ENCOUNTER — Telehealth: Payer: Self-pay | Admitting: *Deleted

## 2019-01-25 NOTE — Telephone Encounter (Signed)
Call from patient c/o small amount of bleeding from post op site. Denies any redness, heat,fever or any signs of infection.  Instructed to wash hands apply gauze dressing with light pressure and elevate extremity above level of heart. Continue to watch and if condition persist or worsen to go to the ER. Patient lives in [place] so to closest ER.

## 2019-02-14 ENCOUNTER — Encounter: Payer: Medicare Other | Admitting: Surgery

## 2020-06-18 IMAGING — DX DG ABD PORTABLE 1V
2 series · 2 of 2 positions shown · non-contrast
Comparison: None.

CLINICAL DATA: Respiratory distress, ileus, distension

EXAM:
PORTABLE ABDOMEN - 1 VIEW

[abdomen kub (1 of 2)]
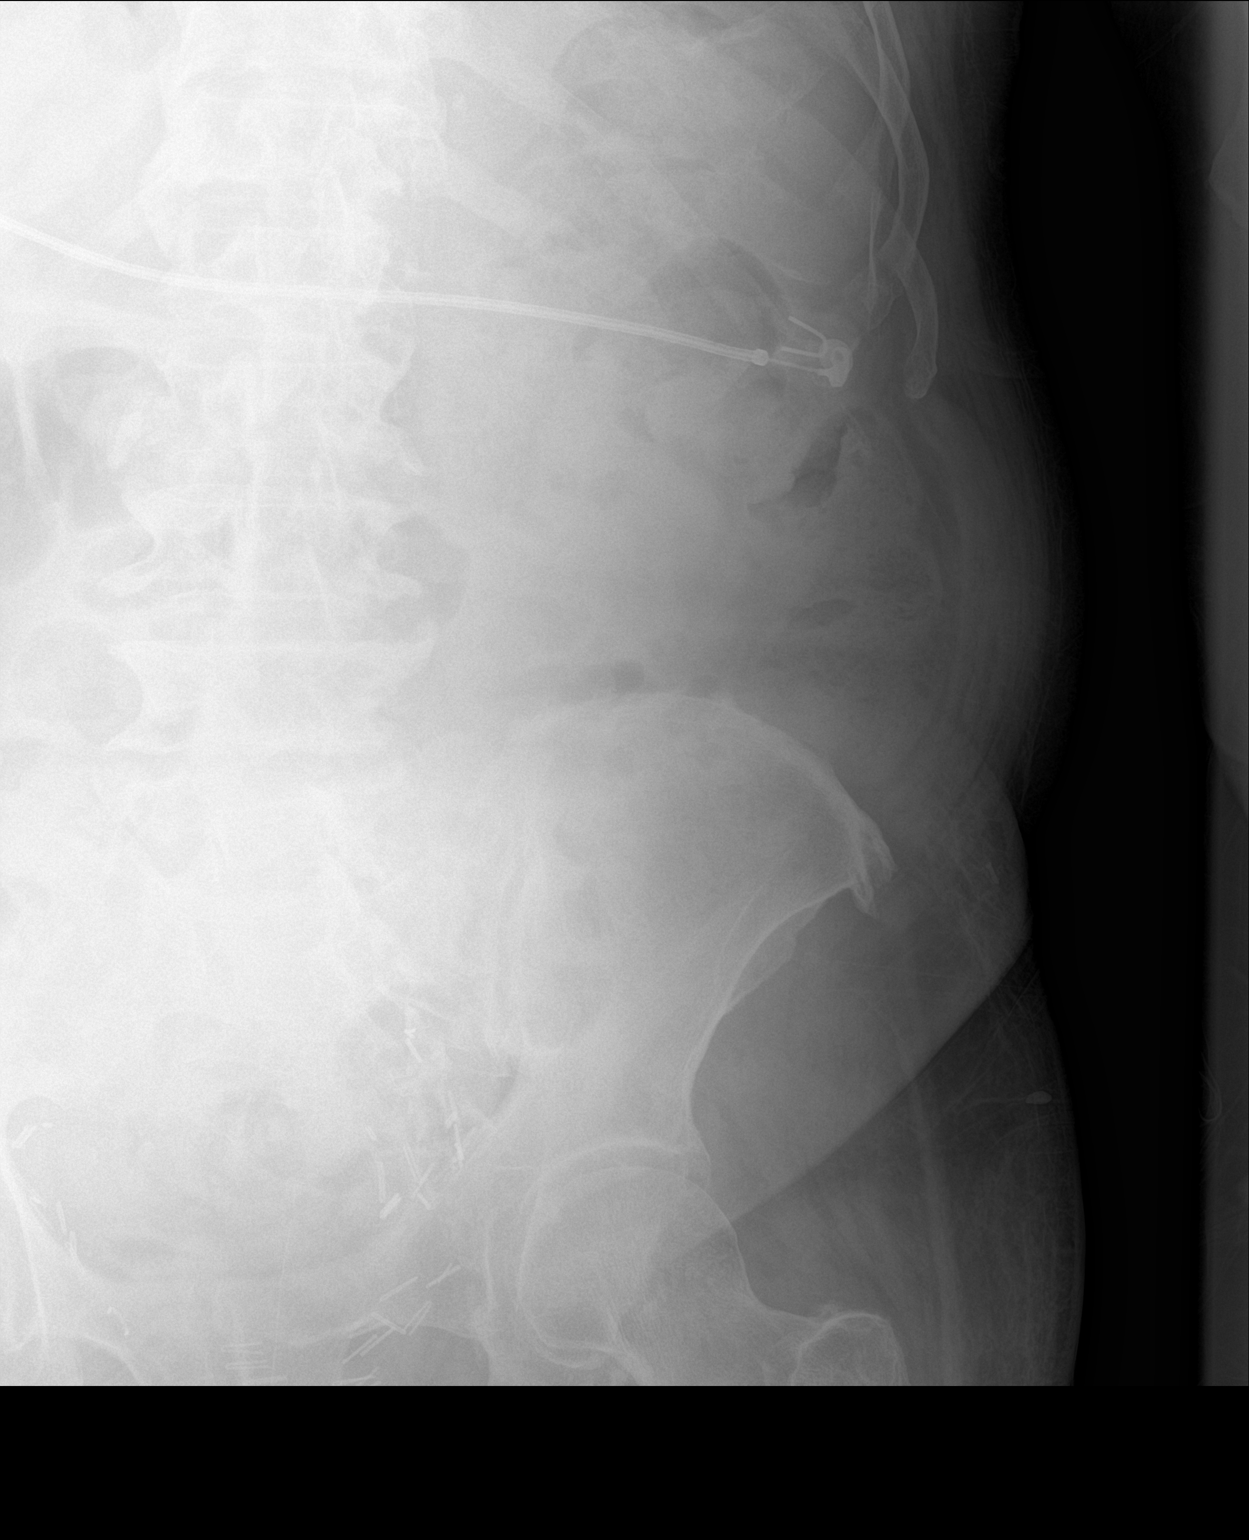

[abdomen kub (2 of 2)]
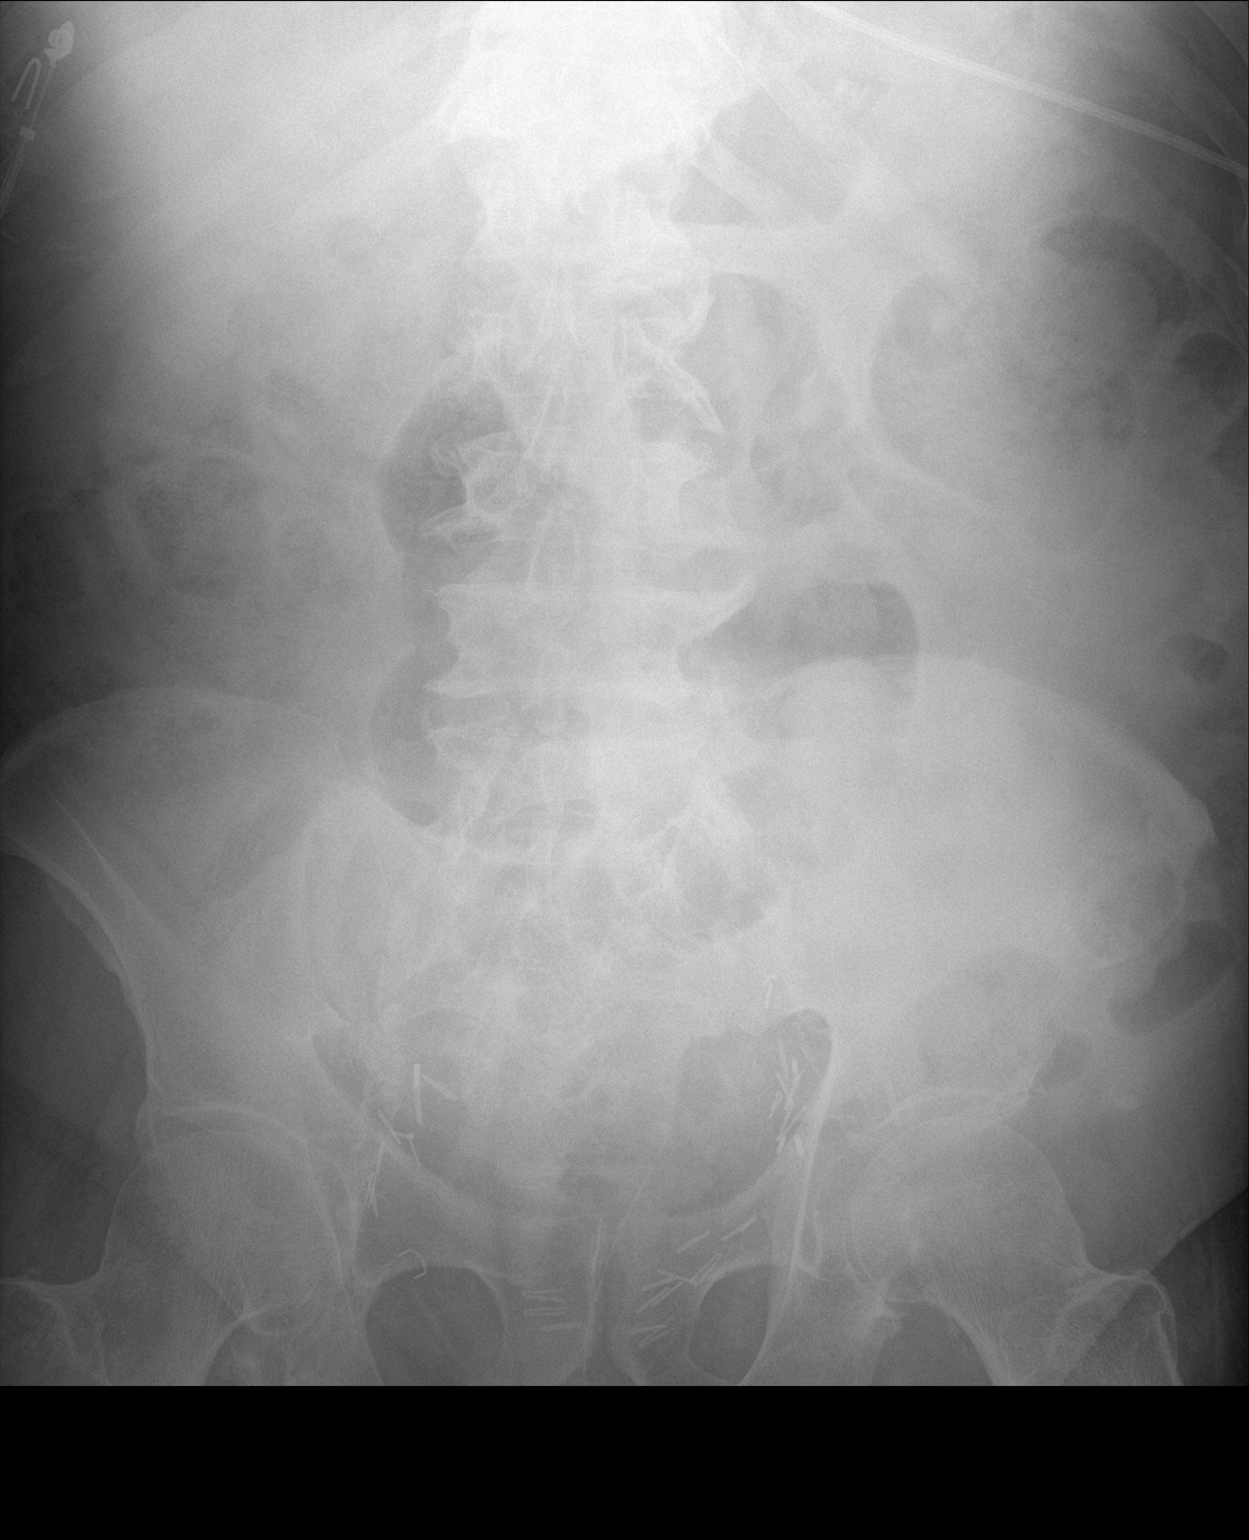

[2 of 2 positions shown; findings below may reference images not displayed]

FINDINGS: Mild mid abdominal small bowel distension but air and stool
throughout the colon. Appearance remains nonspecific. No significant
obstruction pattern. Difficult to exclude mild ileus. Postop changes
in the pelvis. Aortoiliac atherosclerosis present. Degenerative
changes of the spine, pelvis and hips.
IMPRESSION: Nonspecific bowel gas pattern with mild distention centrally. No
obstruction.

## 2021-12-15 DEATH — deceased
